# Patient Record
Sex: Female | Born: 1990 | Race: White | Hispanic: No | Marital: Single | Smoking: Never smoker
Health system: Southern US, Community
[De-identification: ages and names within clinical notes are randomized; demographics above are authoritative.]

## PROBLEM LIST (undated history)

## (undated) DIAGNOSIS — J45909 Unspecified asthma, uncomplicated: Secondary | ICD-10-CM

## (undated) DIAGNOSIS — F419 Anxiety disorder, unspecified: Secondary | ICD-10-CM

## (undated) DIAGNOSIS — G43909 Migraine, unspecified, not intractable, without status migrainosus: Secondary | ICD-10-CM

## (undated) HISTORY — PX: ADENOIDECTOMY: SUR15

## (undated) HISTORY — PX: TONSILLECTOMY: SUR1361

---

## 2020-07-15 DIAGNOSIS — G44229 Chronic tension-type headache, not intractable: Secondary | ICD-10-CM | POA: Diagnosis not present

## 2020-07-27 DIAGNOSIS — F900 Attention-deficit hyperactivity disorder, predominantly inattentive type: Secondary | ICD-10-CM | POA: Diagnosis not present

## 2020-07-27 DIAGNOSIS — F3132 Bipolar disorder, current episode depressed, moderate: Secondary | ICD-10-CM | POA: Diagnosis not present

## 2020-07-27 DIAGNOSIS — F411 Generalized anxiety disorder: Secondary | ICD-10-CM | POA: Diagnosis not present

## 2020-07-27 DIAGNOSIS — Z1389 Encounter for screening for other disorder: Secondary | ICD-10-CM | POA: Diagnosis not present

## 2020-08-02 DIAGNOSIS — E889 Metabolic disorder, unspecified: Secondary | ICD-10-CM | POA: Diagnosis not present

## 2020-08-02 DIAGNOSIS — R0602 Shortness of breath: Secondary | ICD-10-CM | POA: Diagnosis not present

## 2020-08-02 DIAGNOSIS — Z0000 Encounter for general adult medical examination without abnormal findings: Secondary | ICD-10-CM | POA: Diagnosis not present

## 2020-08-02 DIAGNOSIS — F3181 Bipolar II disorder: Secondary | ICD-10-CM | POA: Diagnosis not present

## 2020-08-09 DIAGNOSIS — H52223 Regular astigmatism, bilateral: Secondary | ICD-10-CM | POA: Diagnosis not present

## 2020-08-12 DIAGNOSIS — F3181 Bipolar II disorder: Secondary | ICD-10-CM | POA: Diagnosis not present

## 2020-08-15 DIAGNOSIS — F3132 Bipolar disorder, current episode depressed, moderate: Secondary | ICD-10-CM | POA: Diagnosis not present

## 2020-08-15 DIAGNOSIS — F900 Attention-deficit hyperactivity disorder, predominantly inattentive type: Secondary | ICD-10-CM | POA: Diagnosis not present

## 2020-08-15 DIAGNOSIS — F411 Generalized anxiety disorder: Secondary | ICD-10-CM | POA: Diagnosis not present

## 2020-08-15 DIAGNOSIS — F5081 Binge eating disorder: Secondary | ICD-10-CM | POA: Diagnosis not present

## 2020-08-24 DIAGNOSIS — G4459 Other complicated headache syndrome: Secondary | ICD-10-CM | POA: Diagnosis not present

## 2020-08-24 DIAGNOSIS — G4701 Insomnia due to medical condition: Secondary | ICD-10-CM | POA: Diagnosis not present

## 2020-08-24 DIAGNOSIS — R202 Paresthesia of skin: Secondary | ICD-10-CM | POA: Diagnosis not present

## 2020-08-24 DIAGNOSIS — G4710 Hypersomnia, unspecified: Secondary | ICD-10-CM | POA: Diagnosis not present

## 2020-08-25 DIAGNOSIS — F3181 Bipolar II disorder: Secondary | ICD-10-CM | POA: Diagnosis not present

## 2020-09-01 DIAGNOSIS — F3181 Bipolar II disorder: Secondary | ICD-10-CM | POA: Diagnosis not present

## 2020-09-05 DIAGNOSIS — F411 Generalized anxiety disorder: Secondary | ICD-10-CM | POA: Diagnosis not present

## 2020-09-05 DIAGNOSIS — F5081 Binge eating disorder: Secondary | ICD-10-CM | POA: Diagnosis not present

## 2020-09-05 DIAGNOSIS — F900 Attention-deficit hyperactivity disorder, predominantly inattentive type: Secondary | ICD-10-CM | POA: Diagnosis not present

## 2020-09-05 DIAGNOSIS — F3132 Bipolar disorder, current episode depressed, moderate: Secondary | ICD-10-CM | POA: Diagnosis not present

## 2020-09-09 DIAGNOSIS — F3181 Bipolar II disorder: Secondary | ICD-10-CM | POA: Diagnosis not present

## 2020-09-13 DIAGNOSIS — G4459 Other complicated headache syndrome: Secondary | ICD-10-CM | POA: Diagnosis not present

## 2020-09-13 DIAGNOSIS — R519 Headache, unspecified: Secondary | ICD-10-CM | POA: Diagnosis not present

## 2020-09-22 DIAGNOSIS — Z713 Dietary counseling and surveillance: Secondary | ICD-10-CM | POA: Diagnosis not present

## 2020-09-23 DIAGNOSIS — F3181 Bipolar II disorder: Secondary | ICD-10-CM | POA: Diagnosis not present

## 2020-09-26 DIAGNOSIS — F900 Attention-deficit hyperactivity disorder, predominantly inattentive type: Secondary | ICD-10-CM | POA: Diagnosis not present

## 2020-09-26 DIAGNOSIS — F5081 Binge eating disorder: Secondary | ICD-10-CM | POA: Diagnosis not present

## 2020-09-26 DIAGNOSIS — F3132 Bipolar disorder, current episode depressed, moderate: Secondary | ICD-10-CM | POA: Diagnosis not present

## 2020-09-26 DIAGNOSIS — F411 Generalized anxiety disorder: Secondary | ICD-10-CM | POA: Diagnosis not present

## 2020-09-27 DIAGNOSIS — Z713 Dietary counseling and surveillance: Secondary | ICD-10-CM | POA: Diagnosis not present

## 2020-09-28 DIAGNOSIS — F3181 Bipolar II disorder: Secondary | ICD-10-CM | POA: Diagnosis not present

## 2020-10-01 DIAGNOSIS — G4710 Hypersomnia, unspecified: Secondary | ICD-10-CM | POA: Diagnosis not present

## 2020-10-03 DIAGNOSIS — Z7189 Other specified counseling: Secondary | ICD-10-CM | POA: Diagnosis not present

## 2020-10-03 DIAGNOSIS — R635 Abnormal weight gain: Secondary | ICD-10-CM | POA: Diagnosis not present

## 2020-10-04 DIAGNOSIS — R635 Abnormal weight gain: Secondary | ICD-10-CM | POA: Diagnosis not present

## 2020-10-04 DIAGNOSIS — Z713 Dietary counseling and surveillance: Secondary | ICD-10-CM | POA: Diagnosis not present

## 2020-10-06 DIAGNOSIS — F3181 Bipolar II disorder: Secondary | ICD-10-CM | POA: Diagnosis not present

## 2020-10-12 DIAGNOSIS — F3181 Bipolar II disorder: Secondary | ICD-10-CM | POA: Diagnosis not present

## 2021-02-09 DIAGNOSIS — J069 Acute upper respiratory infection, unspecified: Secondary | ICD-10-CM | POA: Diagnosis not present

## 2021-03-21 DIAGNOSIS — N760 Acute vaginitis: Secondary | ICD-10-CM | POA: Diagnosis not present

## 2021-03-21 DIAGNOSIS — R1032 Left lower quadrant pain: Secondary | ICD-10-CM | POA: Diagnosis not present

## 2021-03-21 DIAGNOSIS — Z124 Encounter for screening for malignant neoplasm of cervix: Secondary | ICD-10-CM | POA: Diagnosis not present

## 2021-03-21 DIAGNOSIS — E282 Polycystic ovarian syndrome: Secondary | ICD-10-CM | POA: Diagnosis not present

## 2021-03-21 DIAGNOSIS — Z01419 Encounter for gynecological examination (general) (routine) without abnormal findings: Secondary | ICD-10-CM | POA: Diagnosis not present

## 2021-03-23 DIAGNOSIS — R519 Headache, unspecified: Secondary | ICD-10-CM | POA: Diagnosis not present

## 2021-03-23 DIAGNOSIS — F32A Depression, unspecified: Secondary | ICD-10-CM | POA: Diagnosis not present

## 2021-03-23 DIAGNOSIS — G43719 Chronic migraine without aura, intractable, without status migrainosus: Secondary | ICD-10-CM | POA: Diagnosis not present

## 2021-05-08 ENCOUNTER — Encounter: Payer: Managed Care, Other (non HMO) | Attending: Obstetrics and Gynecology | Admitting: Registered"

## 2021-05-08 ENCOUNTER — Other Ambulatory Visit: Payer: Self-pay

## 2021-05-08 ENCOUNTER — Encounter: Payer: Self-pay | Admitting: Registered"

## 2021-05-08 DIAGNOSIS — E282 Polycystic ovarian syndrome: Secondary | ICD-10-CM | POA: Insufficient documentation

## 2021-05-08 NOTE — Progress Notes (Signed)
Medical Nutrition Therapy  Appointment Start time:  0205  Appointment End time:  0305  Primary concerns today: PCOS  Referral diagnosis: PCOS Preferred learning style: no preference indicated Learning readiness: ready   NUTRITION ASSESSMENT   Anthropometrics  Not assessed   Clinical Medical Hx: PCOS Medications: (finished high dose vit D ) Labs: 08/02/2020 A1c 5.2%; vitamin D 15(L) Notable Signs/Symptoms: hair growth on chin, tired all the time  Lifestyle & Dietary Hx Pt states she has h/o weight mgmt medication, gym regularly, unspecified eating disorder, low self-esteem. Pt states she first noticed extra hair grown on face recently and also started gaining weight quickly without lifestyle changes, was noticeable when new clothes became tight within a few months..  Pt states she does not eat breakfast because nausea in the morning and has been happening since childhood. Pt states she is generally anxious.  Pt states she is tired all the time. Pt reports it is hard to wake up and hard to stay asleep. Pt wakes up with just enough time to ready for work, has not been able to wake up earlier. Pt reports evening routine includes reading or watching TV before bed, and showers in the evening, goes to bed around 12-1 am. Pt states she wakes up at 7 am if morning shift, later shift sleeps in until 10 am, naps 3-4 x/week 45 min - 3 hours. 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  Pt states she used to do yoga but stopped due to work schedule.   Estimated daily fluid intake: "a lot" of water Supplements: none Sleep: see details above Stress / self-care: 6/10; hot bath, TV Current average weekly physical activity: ADL active at work, Personal assistant, Occupational hygienist, Build a Electrical engineer. 30 min break for lunch  24-Hr Dietary Recall First Meal: not hungry - has nauseous stomach Snack:  Second Meal: nearby restaurant ([employer], 2 cheeseburgers or  chicken nuggets, tea or Dr. Reino Kent) OR cup o noodles Snack: PB crackers OR hummus Third Meal: take out slidder sandwiches, handful of popcorn Snack: sometimes something light miso soup Beverages: mostly water, started drinking soda again 4-5x/week, coffee latte  Estimated Energy Needs Calories: 1600   NUTRITION DIAGNOSIS  NB-1.1 Food and nutrition-related knowledge deficit As related to importance of protein in the morning.  As evidenced by dietary recall skips breakfast.   NUTRITION INTERVENTION  Nutrition education (E-1) on the following topics:  PCOS Weight and hormones Insulin and testosterone Sleep and stress Supplements for PCOS  Handouts Provided Include  Sleep Hygeine Email PCOS nutrition PDF Inositol and PCOS  Learning Style & Readiness for Change Teaching method utilized: Visual & Auditory  Demonstrated degree of understanding via: Teach Back  Barriers to learning/adherence to lifestyle change: none  Goals Established by Pt Vitamin D supplement, start with 2000 units until you have another lab test Inositol supplement handout has ideas for brands Review the PDF for nutrition guidelines for PCOS  stress reduction:  Use the Sleep Hygiene handout for ideas to get better sleep. 20-30 min 2-3 x/week on days off or when you go to work earlier: Yoga and/or Mindfulness resource https://palousemindfulness.com/  When your stomach is better, in the morning start having protein and some carb for breakfast.   MONITORING & EVALUATION Dietary intake, weekly physical activity, and sleep in 4-6 weeks.  Next Steps  Patient is to return for continued education and evaluation.

## 2021-05-08 NOTE — Patient Instructions (Addendum)
Vitamin D supplement, start with 2000 units until you have another lab test Inositol supplement handout has ideas for brands Review the PDF for nutrition guidelines for PCOS  stress reduction:  Use the Sleep Hygiene handout for ideas to get better sleep. 20-30 min 2-3 x/week on days off or when you go to work earlier: Yoga and/or Mindfulness resource https://palousemindfulness.com/  When your stomach is better, in the morning start having protein and some carb for breakfast.

## 2021-06-12 ENCOUNTER — Ambulatory Visit: Payer: Managed Care, Other (non HMO) | Admitting: Registered"

## 2021-07-03 ENCOUNTER — Ambulatory Visit: Payer: Managed Care, Other (non HMO) | Admitting: Registered"

## 2021-07-03 NOTE — Progress Notes (Deleted)
Medical Nutrition Therapy  Appointment Start time:  0205  Appointment End time:  0305  Primary concerns today: PCOS  Referral diagnosis: PCOS Preferred learning style: no preference indicated Learning readiness: ready   NUTRITION ASSESSMENT   Anthropometrics  Not assessed   Clinical Medical Hx: PCOS Medications: (finished high dose vit D ) Labs: 08/02/2020 A1c 5.2%; vitamin D 15(L) Notable Signs/Symptoms: hair growth on chin, tired all the time  Lifestyle & Dietary Hx Pt states she has h/o weight mgmt medication, gym regularly, unspecified eating disorder, low self-esteem. Pt states she first noticed extra hair grown on face recently and also started gaining weight quickly without lifestyle changes, was noticeable when new clothes became tight within a few months..  Pt states she does not eat breakfast because nausea in the morning and has been happening since childhood. Pt states she is generally anxious.  Pt states she is tired all the time. Pt reports it is hard to wake up and hard to stay asleep. Pt wakes up with just enough time to ready for work, has not been able to wake up earlier. Pt reports evening routine includes reading or watching TV before bed, and showers in the evening, goes to bed around 12-1 am. Pt states she wakes up at 7 am if morning shift, later shift sleeps in until 10 am, naps 3-4 x/week 45 min - 3 hours. 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  Pt states she used to do yoga but stopped due to work schedule.   Estimated daily fluid intake: "a lot" of water Supplements: none Sleep: see details above Stress / self-care: 6/10; hot bath, TV Current average weekly physical activity: ADL active at work, lifting boxes, retail manager, [employer]. 30 min break for lunch  24-Hr Dietary Recall First Meal: not hungry - has nauseous stomach Snack:  Second Meal: nearby restaurant ([employer], 2 cheeseburgers or  chicken nuggets, tea or Dr. Pepper) OR cup o noodles Snack: PB crackers OR hummus Third Meal: take out slidder sandwiches, handful of popcorn Snack: sometimes something light miso soup Beverages: mostly water, started drinking soda again 4-5x/week, coffee latte  Estimated Energy Needs Calories: 1600   NUTRITION DIAGNOSIS  NB-1.1 Food and nutrition-related knowledge deficit As related to importance of protein in the morning.  As evidenced by dietary recall skips breakfast.   NUTRITION INTERVENTION  Nutrition education (E-1) on the following topics:  PCOS Weight and hormones Insulin and testosterone Sleep and stress Supplements for PCOS  Handouts Provided Include  Sleep Hygeine Email PCOS nutrition PDF Inositol and PCOS  Learning Style & Readiness for Change Teaching method utilized: Visual & Auditory  Demonstrated degree of understanding via: Teach Back  Barriers to learning/adherence to lifestyle change: none  Goals Established by Pt Vitamin D supplement, start with 2000 units until you have another lab test Inositol supplement handout has ideas for brands Review the PDF for nutrition guidelines for PCOS  stress reduction:  Use the Sleep Hygiene handout for ideas to get better sleep. 20-30 min 2-3 x/week on days off or when you go to work earlier: Yoga and/or Mindfulness resource https://palousemindfulness.com/  When your stomach is better, in the morning start having protein and some carb for breakfast.   MONITORING & EVALUATION Dietary intake, weekly physical activity, and sleep in 4-6 weeks.  Next Steps  Patient is to return for continued education and evaluation.  

## 2021-11-09 ENCOUNTER — Emergency Department (HOSPITAL_COMMUNITY)
Admission: EM | Admit: 2021-11-09 | Discharge: 2021-11-10 | Disposition: A | Discharge status: Home / Self Care | Payer: Medicaid Other | Attending: Emergency Medicine | Admitting: Emergency Medicine

## 2021-11-09 ENCOUNTER — Emergency Department (HOSPITAL_COMMUNITY): Payer: Medicaid Other

## 2021-11-09 ENCOUNTER — Other Ambulatory Visit: Payer: Self-pay

## 2021-11-09 ENCOUNTER — Encounter (HOSPITAL_COMMUNITY): Payer: Self-pay | Admitting: Emergency Medicine

## 2021-11-09 DIAGNOSIS — R0602 Shortness of breath: Secondary | ICD-10-CM | POA: Insufficient documentation

## 2021-11-09 DIAGNOSIS — G43809 Other migraine, not intractable, without status migrainosus: Secondary | ICD-10-CM | POA: Insufficient documentation

## 2021-11-09 DIAGNOSIS — Z7951 Long term (current) use of inhaled steroids: Secondary | ICD-10-CM | POA: Insufficient documentation

## 2021-11-09 DIAGNOSIS — J45909 Unspecified asthma, uncomplicated: Secondary | ICD-10-CM | POA: Insufficient documentation

## 2021-11-09 DIAGNOSIS — R000 Tachycardia, unspecified: Secondary | ICD-10-CM | POA: Insufficient documentation

## 2021-11-09 DIAGNOSIS — R791 Abnormal coagulation profile: Secondary | ICD-10-CM | POA: Insufficient documentation

## 2021-11-09 HISTORY — DX: Anxiety disorder, unspecified: F41.9

## 2021-11-09 HISTORY — DX: Unspecified asthma, uncomplicated: J45.909

## 2021-11-09 HISTORY — DX: Migraine, unspecified, not intractable, without status migrainosus: G43.909

## 2021-11-09 LAB — CBC WITH DIFFERENTIAL/PLATELET
Abs Immature Granulocytes: 0.02 10*3/uL (ref 0.00–0.07)
Basophils Absolute: 0 10*3/uL (ref 0.0–0.1)
Basophils Relative: 0 %
Eosinophils Absolute: 0.1 10*3/uL (ref 0.0–0.5)
Eosinophils Relative: 2 %
HCT: 42 % (ref 36.0–46.0)
Hemoglobin: 13.5 g/dL (ref 12.0–15.0)
Immature Granulocytes: 0 %
Lymphocytes Relative: 25 %
Lymphs Abs: 1.7 10*3/uL (ref 0.7–4.0)
MCH: 27.5 pg (ref 26.0–34.0)
MCHC: 32.1 g/dL (ref 30.0–36.0)
MCV: 85.5 fL (ref 80.0–100.0)
Monocytes Absolute: 0.5 10*3/uL (ref 0.1–1.0)
Monocytes Relative: 7 %
Neutro Abs: 4.5 10*3/uL (ref 1.7–7.7)
Neutrophils Relative %: 66 %
Platelets: 432 10*3/uL — ABNORMAL HIGH (ref 150–400)
RBC: 4.91 MIL/uL (ref 3.87–5.11)
RDW: 12.6 % (ref 11.5–15.5)
WBC: 6.9 10*3/uL (ref 4.0–10.5)
nRBC: 0 % (ref 0.0–0.2)

## 2021-11-09 LAB — RAPID URINE DRUG SCREEN, HOSP PERFORMED
Amphetamines: NOT DETECTED
Barbiturates: NOT DETECTED
Benzodiazepines: NOT DETECTED
Cocaine: NOT DETECTED
Opiates: NOT DETECTED
Tetrahydrocannabinol: NOT DETECTED

## 2021-11-09 LAB — COMPREHENSIVE METABOLIC PANEL
ALT: 17 U/L (ref 0–44)
AST: 17 U/L (ref 15–41)
Albumin: 3.6 g/dL (ref 3.5–5.0)
Alkaline Phosphatase: 67 U/L (ref 38–126)
Anion gap: 7 (ref 5–15)
BUN: 5 mg/dL — ABNORMAL LOW (ref 6–20)
CO2: 20 mmol/L — ABNORMAL LOW (ref 22–32)
Calcium: 9 mg/dL (ref 8.9–10.3)
Chloride: 112 mmol/L — ABNORMAL HIGH (ref 98–111)
Creatinine, Ser: 0.53 mg/dL (ref 0.44–1.00)
GFR, Estimated: >60 mL/min (ref >60)
Glucose, Bld: 100 mg/dL — ABNORMAL HIGH (ref 70–99)
Potassium: 3.9 mmol/L (ref 3.5–5.1)
Sodium: 139 mmol/L (ref 135–145)
Total Bilirubin: 0.2 mg/dL — ABNORMAL LOW (ref 0.3–1.2)
Total Protein: 7.4 g/dL (ref 6.5–8.1)

## 2021-11-09 LAB — D-DIMER, QUANTITATIVE: D-Dimer, Quant: 0.53 ug/mL-FEU — ABNORMAL HIGH (ref 0.00–0.50)

## 2021-11-09 LAB — I-STAT BETA HCG BLOOD, ED (MC, WL, AP ONLY): I-stat hCG, quantitative: <5 m[IU]/mL (ref <5)

## 2021-11-09 LAB — ETHANOL: Alcohol, Ethyl (B): <10 mg/dL (ref <10)

## 2021-11-09 MED ORDER — IOHEXOL 350 MG/ML SOLN
59.0000 mL | Freq: Once | INTRAVENOUS | Status: AC | PRN
  Administered 2021-11-09: 59 mL via INTRAVENOUS

## 2021-11-09 MED ORDER — KETOROLAC TROMETHAMINE 15 MG/ML IJ SOLN
15.0000 mg | Freq: Once | INTRAMUSCULAR | Status: AC
  Administered 2021-11-09: 15 mg via INTRAVENOUS
  Filled 2021-11-09: qty 1

## 2021-11-09 MED ORDER — DEXAMETHASONE SODIUM PHOSPHATE 10 MG/ML IJ SOLN
10.0000 mg | Freq: Once | INTRAMUSCULAR | Status: AC
  Administered 2021-11-09: 10 mg via INTRAVENOUS
  Filled 2021-11-09: qty 1

## 2021-11-09 MED ORDER — METOCLOPRAMIDE HCL 5 MG/ML IJ SOLN
10.0000 mg | Freq: Once | INTRAMUSCULAR | Status: AC
  Administered 2021-11-09: 10 mg via INTRAVENOUS
  Filled 2021-11-09: qty 2

## 2021-11-09 NOTE — ED Provider Triage Note (Signed)
Emergency Medicine Provider Triage Evaluation Note  Philomena Doheny , a 31 y.o. female  was evaluated in triage.  Pt complains of band like headache for the past few days. Today at 10:40 was driving to work when she noticed tingling in her left arm and left cheek area, also blurry vision. Symptoms lasted a few min and resolved completely with exception of band like headache. History of migraines, states this is different. No changes in speech or gait.   Review of Systems  Positive: Headache, paresthesia (resolved) Negative: Changes in speech/gait  Physical Exam  There were no vitals taken for this visit. Gen:   Awake, no distress   Resp:  Normal effort  MSK:   Moves extremities without difficulty  Other:  No arm weakness/drift, no numbness, no facial asymmetry  Medical Decision Making  Medically screening exam initiated at 11:18 AM.  Appropriate orders placed.  [place] Razzano was informed that the remainder of the evaluation will be completed by another provider, this initial triage assessment does not replace that evaluation, and the importance of remaining in the ED until their evaluation is complete.     Tacy Learn, PA-C 11/09/21 1132

## 2021-11-09 NOTE — ED Notes (Signed)
Patient transported to CT 

## 2021-11-09 NOTE — ED Provider Notes (Signed)
[facility]   CSN: [id number] Arrival date & time: 11/09/21  1116     History  Chief Complaint  Patient presents with   Headache    Kimberly Wolf is a 31 y.o. female.  Patient is a 31 year old female with a history of migraines, asthma and anxiety who is presenting today with paramedics after having sudden onset of tingling in her left face arm and blurred vision with a worsening headache while she was driving.  Patient reports for the last 4 days she has had ongoing headache that feels like a band going around her head in addition to a otitis externa that has been treated with drops.  She reports the ear issues are getting better she has not had fever, runny nose or cough but continues to feel the pressure in her head.  Moves around and tingles and feels slightly different than her typical migraines.  Today while she was driving she developed the above symptoms suddenly which caused her to pull over and lasted approximately 5 minutes and resolved by the time EMS arrived.  Her headache has been more severe since that time but those symptoms have not returned.  She had no difficulty with speech, walking or strength.  She reports 1 other time with her migraines she has had similar symptoms but they were not that severe.  She continues to take the Topamax for her migraines which she has been on for about a year.  She has no neck pain. Secondly patient reports that for the last 1 to 2 months she has noticed progressively worsening shortness of breath with any type of exertion that is not improved with her inhaler.  She has not had a new cough, fever.  She does not smoke but does report 6 months ago she had her Nexplanon removed and has been on an estrogen birth control since that time.  The history is provided by the spouse and the patient.  Headache      Home Medications Prior to Admission medications   Medication Sig Start Date End Date  Taking? Authorizing Provider  ALBUTEROL SULFATE ER PO Take by mouth.    [provider]  Norgestimate-Eth Estradiol (ESTARYLLA PO) Take by mouth.    [provider]      Allergies    Patient has no known allergies.    Review of Systems   Review of Systems  Neurological:  Positive for headaches.    Physical Exam Updated Vital Signs BP (!) 126/95   Pulse 94   Temp 99.1 F (37.3 C) (Oral)   Resp 17   SpO2 100%  Physical Exam Vitals and nursing note reviewed.  Constitutional:      General: She is not in acute distress.    Appearance: She is well-developed.  HENT:     Head: Normocephalic and atraumatic.  Eyes:     General: No visual field deficit.    Conjunctiva/sclera: Conjunctivae normal.     Pupils: Pupils are equal, round, and reactive to light.  Cardiovascular:     Rate and Rhythm: Normal rate and regular rhythm.     Heart sounds: No murmur heard. Pulmonary:     Effort: Pulmonary effort is normal. No respiratory distress.     Breath sounds: Normal breath sounds. No wheezing or rales.  Abdominal:     General: There is no distension.     Palpations: Abdomen is soft.     Tenderness: There is no abdominal  tenderness. There is no guarding or rebound.  Musculoskeletal:        General: No tenderness. Normal range of motion.     Cervical back: Normal range of motion and neck supple. No tenderness.  Skin:    General: Skin is warm and dry.     Findings: No erythema or rash.  Neurological:     Mental Status: She is alert and oriented to person, place, and time. Mental status is at baseline.     Cranial Nerves: No cranial nerve deficit, dysarthria or facial asymmetry.     Sensory: No sensory deficit.     Motor: No weakness or pronator drift.     Coordination: Coordination normal. Heel to Shin Test normal.  Psychiatric:        Mood and Affect: Mood normal.        Behavior: Behavior normal.     ED Results / Procedures / Treatments   Labs (all labs  ordered are listed, but only abnormal results are displayed) Labs Reviewed  CBC WITH DIFFERENTIAL/PLATELET - Abnormal; Notable for the following components:      Result Value   Platelets 432 (*)    All other components within normal limits  COMPREHENSIVE METABOLIC PANEL - Abnormal; Notable for the following components:   Chloride 112 (*)    CO2 20 (*)    Glucose, Bld 100 (*)    BUN 5 (*)    Total Bilirubin 0.2 (*)    All other components within normal limits  ETHANOL  RAPID URINE DRUG SCREEN, HOSP PERFORMED  D-DIMER, QUANTITATIVE  I-STAT BETA HCG BLOOD, ED ([facility], [facility], [facility] ONLY)    EKG None  Radiology CT Head Wo Contrast  Result Date: 11/09/2021 CLINICAL DATA:  Transient ischemic attack (TIA) EXAM: CT HEAD WITHOUT CONTRAST TECHNIQUE: Contiguous axial images were obtained from the base of the skull through the vertex without intravenous contrast. RADIATION DOSE REDUCTION: This exam was performed according to the departmental dose-optimization program which includes automated exposure control, adjustment of the mA and/or kV according to patient size and/or use of iterative reconstruction technique. COMPARISON:  None Available. FINDINGS: Brain: No evidence of acute large vascular territory infarction, hemorrhage, hydrocephalus, extra-axial collection or mass lesion/mass effect. Vascular: No hyperdense vessel identified. Skull: No acute fracture. Sinuses/Orbits: Minimal paranasal sinus mucosal thickening. No acute orbital findings. Other: No mastoid effusions. IMPRESSION: No evidence of acute intracranial abnormality. Electronically Signed   By: Feliberto Harts M.D.   On: 11/09/2021 13:23    Procedures Procedures    Medications Ordered in ED Medications  metoCLOPramide (REGLAN) injection 10 mg (has no administration in time range)  ketorolac (TORADOL) 15 MG/ML injection 15 mg (has no administration in time range)  dexamethasone (DECADRON) injection 10 mg (has no administration in time  range)    ED Course/ Medical Decision Making/ A&P                           Medical Decision Making Amount and/or Complexity of Data Reviewed External Data Reviewed: notes. Labs: ordered. Decision-making details documented in ED Course. Radiology: ordered and independent interpretation performed. Decision-making details documented in ED Course. ECG/medicine tests: ordered and independent interpretation performed. Decision-making details documented in ED Course.  Risk Prescription drug management.   Patient is a 31 year old female presenting today with headache and neurologic complaints.  The neuro complaints lasted approximately 5 minutes but have now resolved and she continues with a headache.  It is slightly  different than her typical migraine headache but neuro exam is intact.  Patient did recently started on an estrogen birth control 6 months ago and has also been complaining of some shortness of breath.  Temperature is 99.1 here with mild tachycardia relative to age at 47.  Concern for possible PE with increased risk of being on birth control.  Lower suspicious for cavernous thrombosis.  Suspect complicated migraine.  Low suspicion for sinusitis or other infectious causes of patient's headache.  Neurologically intact without any acute findings. I independently interpreted patient's labs today which show normal CBC, CMP, UDS and urine pregnancy. I have independently visualized and interpreted pt's images today.  Showed no evidence of intracranial hemorrhage or hydrocephalus.  D-dimer done based on patient's complaint of shortness of breath and recent OCP initiation.  10:12 PM On reevaluation patient's headache has improved with the headache cocktail but unfortunately D-dimer is elevated at 0.53.  Findings discussed with the patient.  CTA to further evaluate and rule out PE.         Final Clinical Impression(s) / ED Diagnoses Final diagnoses:  None    Rx / DC Orders ED  Discharge Orders     None         Gwyneth Sprout, MD 11/09/21 2322

## 2021-11-09 NOTE — ED Notes (Signed)
This RN attempted PIV for CT scan and was unsuccessful due to patient being a difficult stick. CT tech at bedside to attempt PIV at this time.

## 2021-11-09 NOTE — ED Triage Notes (Signed)
Pt was driving to work and had sudden onset of headache into temples. Pt had recent left ear infection. Pt states she had a brief period of blurry vision while driving so she pulled off the road and called ems. Vision is better, but pt still has tightness in her head. No slurred speech, no weakness.

## 2021-11-09 NOTE — ED Notes (Signed)
ED Provider at bedside. 

## 2021-11-10 NOTE — ED Notes (Signed)
All discharge instructions including follow up care and prescriptions reviewed with patient and patient verbalized understanding of same. Patient stable and ambulatory at time of discharge and was discharged home with ride from family member.

## 2021-11-10 NOTE — ED Provider Notes (Signed)
Patient signed out to me by Dr. Anitra Lauth to follow-up on CT angio chest.  Patient was seen for migraine headache, treated with medications and headache improved.  She did, however, reports some shortness of breath.  She had some intermittent episodes of tachycardia and is on an estrogen based birth control pill, therefore CT was ordered when her D-dimer was slightly elevated.  This has been reviewed and is negative.  Patient does demo that she has a history of asthma and this may be the cause of some of her shortness of breath.  She reports that this has been ongoing over a period of months.  She does not currently have a PCP but does have a supply of albuterol.  I do not see a need for steroids at this time.   Gilda Crease, MD 11/10/21 [phone]

## 2021-11-23 ENCOUNTER — Ambulatory Visit
Admission: RE | Admit: 2021-11-23 | Discharge status: Absent without leave | Payer: Medicaid Other | Source: Ambulatory Visit

## 2021-11-23 DIAGNOSIS — Z760 Encounter for issue of repeat prescription: Secondary | ICD-10-CM | POA: Diagnosis not present

## 2021-11-23 DIAGNOSIS — Z0001 Encounter for general adult medical examination with abnormal findings: Secondary | ICD-10-CM | POA: Diagnosis not present

## 2021-11-28 DIAGNOSIS — F411 Generalized anxiety disorder: Secondary | ICD-10-CM | POA: Diagnosis not present

## 2021-11-28 DIAGNOSIS — F5081 Binge eating disorder: Secondary | ICD-10-CM | POA: Diagnosis not present

## 2021-11-28 DIAGNOSIS — F900 Attention-deficit hyperactivity disorder, predominantly inattentive type: Secondary | ICD-10-CM | POA: Diagnosis not present

## 2021-11-28 DIAGNOSIS — F331 Major depressive disorder, recurrent, moderate: Secondary | ICD-10-CM | POA: Diagnosis not present

## 2021-11-30 DIAGNOSIS — F3181 Bipolar II disorder: Secondary | ICD-10-CM | POA: Diagnosis not present

## 2021-12-07 DIAGNOSIS — F3181 Bipolar II disorder: Secondary | ICD-10-CM | POA: Diagnosis not present

## 2021-12-22 DIAGNOSIS — F411 Generalized anxiety disorder: Secondary | ICD-10-CM | POA: Diagnosis not present

## 2021-12-22 DIAGNOSIS — F5081 Binge eating disorder: Secondary | ICD-10-CM | POA: Diagnosis not present

## 2021-12-22 DIAGNOSIS — F340 Cyclothymic disorder: Secondary | ICD-10-CM | POA: Diagnosis not present

## 2021-12-22 DIAGNOSIS — F900 Attention-deficit hyperactivity disorder, predominantly inattentive type: Secondary | ICD-10-CM | POA: Diagnosis not present

## 2021-12-26 DIAGNOSIS — F3181 Bipolar II disorder: Secondary | ICD-10-CM | POA: Diagnosis not present

## 2022-01-02 DIAGNOSIS — G43839 Menstrual migraine, intractable, without status migrainosus: Secondary | ICD-10-CM | POA: Diagnosis not present

## 2022-01-02 DIAGNOSIS — Z79899 Other long term (current) drug therapy: Secondary | ICD-10-CM | POA: Diagnosis not present

## 2022-01-02 DIAGNOSIS — G43719 Chronic migraine without aura, intractable, without status migrainosus: Secondary | ICD-10-CM | POA: Diagnosis not present

## 2022-01-02 DIAGNOSIS — F3181 Bipolar II disorder: Secondary | ICD-10-CM | POA: Diagnosis not present

## 2022-01-02 DIAGNOSIS — Z049 Encounter for examination and observation for unspecified reason: Secondary | ICD-10-CM | POA: Diagnosis not present

## 2022-01-04 DIAGNOSIS — N760 Acute vaginitis: Secondary | ICD-10-CM | POA: Diagnosis not present

## 2022-01-04 DIAGNOSIS — L738 Other specified follicular disorders: Secondary | ICD-10-CM | POA: Diagnosis not present

## 2022-01-04 DIAGNOSIS — B372 Candidiasis of skin and nail: Secondary | ICD-10-CM | POA: Diagnosis not present

## 2022-01-19 DIAGNOSIS — F411 Generalized anxiety disorder: Secondary | ICD-10-CM | POA: Diagnosis not present

## 2022-01-19 DIAGNOSIS — F5081 Binge eating disorder: Secondary | ICD-10-CM | POA: Diagnosis not present

## 2022-01-19 DIAGNOSIS — F900 Attention-deficit hyperactivity disorder, predominantly inattentive type: Secondary | ICD-10-CM | POA: Diagnosis not present

## 2022-01-19 DIAGNOSIS — F340 Cyclothymic disorder: Secondary | ICD-10-CM | POA: Diagnosis not present

## 2022-01-22 ENCOUNTER — Institutional Professional Consult (permissible substitution): Payer: BC Managed Care – PPO | Admitting: Nurse Practitioner

## 2022-01-23 DIAGNOSIS — M542 Cervicalgia: Secondary | ICD-10-CM | POA: Diagnosis not present

## 2022-01-23 DIAGNOSIS — G43719 Chronic migraine without aura, intractable, without status migrainosus: Secondary | ICD-10-CM | POA: Diagnosis not present

## 2022-02-01 ENCOUNTER — Ambulatory Visit (INDEPENDENT_AMBULATORY_CARE_PROVIDER_SITE_OTHER): Payer: BC Managed Care – PPO | Admitting: Nurse Practitioner

## 2022-02-01 ENCOUNTER — Encounter: Payer: Self-pay | Admitting: Nurse Practitioner

## 2022-02-01 VITALS — BP 110/80 | HR 109 | Ht 62.0 in | Wt 197.6 lb

## 2022-02-01 DIAGNOSIS — E669 Obesity, unspecified: Secondary | ICD-10-CM

## 2022-02-01 DIAGNOSIS — G4710 Hypersomnia, unspecified: Secondary | ICD-10-CM

## 2022-02-01 NOTE — Progress Notes (Signed)
@Patient  ID: Kimberly Wolf, female    DOB: 04/01/1991, 31 y.o.   MRN: [id number]  Chief Complaint  Patient presents with   Consult    Sleep study completed 22yr ago    Referring provider: Orie Rout, MD  HPI: 31 year old female, never smoker referred for sleep consult.  Past medical history significant for migraine headaches.  TEST/EVENTS:  10/01/2020 HST: AHI 1.8, SPO2 low 88%  02/01/2022: Today - sleep consult  Patient presents today for sleep consult, referred by Dr. Domingo Cocking.  She has been struggling with excessive daytime fatigue symptoms for the past few years.  She falls asleep easily throughout the day without intending to.  Finds herself nodding off unless she is actively participating in a conversation with somebody or up doing something.  She struggles with memory impairment from time to time.  Forgets things easily.  She struggles with restless sleep at night.  Wakes up frequently throughout the night.  Tosses and turns often.  Also notices having vivid dreams.  She also has had a problem with chronic migraine headaches for many years now, which she sees neurology for.  Because of this, they wanted her to undergo sleep study to rule out obstructive sleep apnea, which she completed 10/01/2020 and showed no significant sleep apnea.  She has trouble with drowsy driving.  Has not fallen asleep while driving.  Denies any sleep paralysis or cataplexy.  She has never been told that she snores.  Does not wake up gasping for air. She goes to bed around 10 to 11 PM but it takes her about an hour to fall asleep so she ends up not falling asleep until around midnight.  Wakes up 3 or more times a night.  Officially gets out of the bed in the morning around 630 to 7 AM.  Wakes feeling poorly rested.  She does not take anything to help her fall asleep at night.  Does not drink any caffeine throughout the day. Her weight is up about 30 pounds.  No significant cardiac history.  She does not  smoke.  Rarely drinks alcohol.  Lives with her family.  She works as a Secretary/administrator.  Does not operate any heavy machinery.  She does have a history of asthma which is well controlled.  Epworth 14  No Known Allergies   There is no immunization history on file for this patient.  Past Medical History:  Diagnosis Date   Anxiety    Asthma    Migraines     Tobacco History: Social History   Tobacco Use  Smoking Status Never  Smokeless Tobacco Never   Counseling given: Not Answered   Outpatient Medications Prior to Visit  Medication Sig Dispense Refill   ALBUTEROL SULFATE ER PO Take by mouth.     Norgestimate-Eth Estradiol (ESTARYLLA PO) Take by mouth.     VRAYLAR 1.5 MG capsule Take 1.5 mg by mouth daily.     zonisamide (ZONEGRAN) 25 MG capsule Take 100 mg by mouth daily.     No facility-administered medications prior to visit.     Review of Systems:   Constitutional: No night sweats, fevers, chills. +excessive daytime fatigue, weight gain (30 lb) HEENT: No difficulty swallowing, tooth/dental problems, or sore throat. No sneezing, itching, ear ache, nasal congestion, or post nasal drip. +chronic headaches CV:  No chest pain, orthopnea, PND, swelling in lower extremities, anasarca, dizziness, palpitations, syncope Resp: No shortness of breath with exertion or at rest. No excess mucus or change in  color of mucus. No productive or non-productive. No hemoptysis. No wheezing.  No chest wall deformity GI:  No heartburn, indigestion, abdominal pain, nausea, vomiting, diarrhea, change in bowel habits, loss of appetite, bloody stools.  GU: No dysuria, change in color of urine, urgency or frequency.  No flank pain, no hematuria  Skin: No rash, lesions, ulcerations MSK:  No joint pain or swelling.  No decreased range of motion.  No back pain. Neuro: No dizziness or lightheadedness.  Psych: No depression or anxiety. Mood stable. +sleep disturbance    Physical Exam:  BP 110/80 (BP  Location: Right Arm, Cuff Size: Normal)   Pulse (!) 109   Ht 5\' 2"  (1.575 m)   Wt 197 lb 9.6 oz (89.6 kg)   SpO2 98%   BMI 36.14 kg/m   GEN: Pleasant, interactive, well-appearing; obese; in no acute distress. HEENT:  Normocephalic and atraumatic. PERRLA. Sclera white. Nasal turbinates pink, moist and patent bilaterally. No rhinorrhea present. Oropharynx pink and moist, without exudate or edema. No lesions, ulcerations, or postnasal drip. Mallampati II NECK:  Supple w/ fair ROM. No JVD present. Normal carotid impulses w/o bruits. Thyroid symmetrical with no goiter or nodules palpated. No lymphadenopathy.   CV: RRR, no m/r/g, no peripheral edema. Pulses intact, +2 bilaterally. No cyanosis, pallor or clubbing. PULMONARY:  Unlabored, regular breathing. Clear bilaterally A&P w/o wheezes/rales/rhonchi. No accessory muscle use. No dullness to percussion. GI: BS present and normoactive. Soft, non-tender to palpation.  MSK: No erythema, warmth or tenderness.  Neuro: A/Ox3. No focal deficits noted.   Skin: Warm, no lesions or rashe Psych: Normal affect and behavior. Judgement and thought content appropriate.     Lab Results:  CBC    Component Value Date/Time   WBC 6.9 11/09/2021 1140   RBC 4.91 11/09/2021 1140   HGB 13.5 11/09/2021 1140   HCT 42.0 11/09/2021 1140   PLT 432 (H) 11/09/2021 1140   MCV 85.5 11/09/2021 1140   MCH 27.5 11/09/2021 1140   MCHC 32.1 11/09/2021 1140   RDW 12.6 11/09/2021 1140   LYMPHSABS 1.7 11/09/2021 1140   MONOABS 0.5 11/09/2021 1140   EOSABS 0.1 11/09/2021 1140   BASOSABS 0.0 11/09/2021 1140    BMET    Component Value Date/Time   NA 139 11/09/2021 1140   K 3.9 11/09/2021 1140   CL 112 (H) 11/09/2021 1140   CO2 20 (L) 11/09/2021 1140   GLUCOSE 100 (H) 11/09/2021 1140   BUN 5 (L) 11/09/2021 1140   CREATININE 0.53 11/09/2021 1140   CALCIUM 9.0 11/09/2021 1140   GFRNONAA >60 11/09/2021 1140    BNP No results found for: "BNP"   Imaging:  No  results found.        No data to display          No results found for: "NITRICOXIDE"      Assessment & Plan:   Hypersomnia She has excessive daytime fatigue symptoms, unintentionally falls asleep throughout the day, restless night sleep, and chronic headaches. Young female. Epworth 14. She underwent HST in 09/2020 with no significant OSA. I am concerned that she could have narcolepsy. We will ensure that she does not have any sleep disordered breathing with split night sleep study and then complete MSLT the following day for evaluation of narcolepsy. Encouraged her to work on sleep hygiene and keep a sleep diary in the interim. Cautioned to be aware of drowsy driving and pull over if she becomes sleepy.  Patient Instructions  Given your symptoms,  I am concerned for sleep disordered breathing as well as possible narcolepsy. We will set you up for an overnight split night sleep study with sleep latency test to follow.   Use caution while driving and pull over if you become sleepy  Try to schedule 1-2 naps during the day, if possible, and see how this helps  Keep a sleep diary Write down: When you sleep. When you wake up during the night. How well you sleep and how rested you feel the next day. Any side effects of medicines you are taking. What you eat and drink.  Follow up in 6-8 weeks with Florentina Addison Charleton Deyoung,NP or sooner if needed     Obesity (BMI 30.0-34.9) Healthy weight loss encouraged. Discussed the association between obesity and OSA.   I spent 45 minutes of dedicated to the care of this patient on the date of this encounter to include pre-visit review of records, face-to-face time with the patient discussing conditions above, post visit ordering of testing, clinical documentation with the electronic health record, making appropriate referrals as documented, and communicating necessary findings to members of the patients care team.  Noemi Chapel, NP 02/02/2022  Pt aware  and understands NP's role.

## 2022-02-01 NOTE — Patient Instructions (Addendum)
Given your symptoms, I am concerned for sleep disordered breathing as well as possible narcolepsy. We will set you up for an overnight split night sleep study with sleep latency test to follow.   Use caution while driving and pull over if you become sleepy  Try to schedule 1-2 naps during the day, if possible, and see how this helps  Keep a sleep diary Write down: When you sleep. When you wake up during the night. How well you sleep and how rested you feel the next day. Any side effects of medicines you are taking. What you eat and drink.  Follow up in 6-8 weeks with Florentina Addison Satchel Heidinger,NP or sooner if needed

## 2022-02-02 ENCOUNTER — Encounter: Payer: Self-pay | Admitting: Nurse Practitioner

## 2022-02-02 DIAGNOSIS — E66811 Obesity, class 1: Secondary | ICD-10-CM | POA: Insufficient documentation

## 2022-02-02 DIAGNOSIS — G4710 Hypersomnia, unspecified: Secondary | ICD-10-CM | POA: Insufficient documentation

## 2022-02-02 DIAGNOSIS — E669 Obesity, unspecified: Secondary | ICD-10-CM | POA: Insufficient documentation

## 2022-02-02 NOTE — Assessment & Plan Note (Signed)
Healthy weight loss encouraged. Discussed the association between obesity and OSA.

## 2022-02-02 NOTE — Assessment & Plan Note (Addendum)
She has excessive daytime fatigue symptoms, unintentionally falls asleep throughout the day, restless night sleep, and chronic headaches. Young female. Epworth 14. She underwent HST in 09/2020 with no significant OSA. I am concerned that she could have narcolepsy. We will ensure that she does not have any sleep disordered breathing with split night sleep study and then complete MSLT the following day for evaluation of narcolepsy. Encouraged her to work on sleep hygiene and keep a sleep diary in the interim. Cautioned to be aware of drowsy driving and pull over if she becomes sleepy.  Patient Instructions  Given your symptoms, I am concerned for sleep disordered breathing as well as possible narcolepsy. We will set you up for an overnight split night sleep study with sleep latency test to follow.   Use caution while driving and pull over if you become sleepy  Try to schedule 1-2 naps during the day, if possible, and see how this helps  Keep a sleep diary Write down: ? When you sleep. ? When you wake up during the night. ? How well you sleep and how rested you feel the next day. ? Any side effects of medicines you are taking. ? What you eat and drink.  Follow up in 6-8 weeks with Florentina Addison Dvante Hands,NP or sooner if needed

## 2022-02-06 DIAGNOSIS — F3181 Bipolar II disorder: Secondary | ICD-10-CM | POA: Diagnosis not present

## 2022-02-06 NOTE — Progress Notes (Signed)
Reviewed and agree with assessment/plan.   Corydon Schweiss, MD [facility] 02/06/2022, 8:08 AM Pager:  [phone]  

## 2022-02-07 DIAGNOSIS — M542 Cervicalgia: Secondary | ICD-10-CM | POA: Diagnosis not present

## 2022-02-07 DIAGNOSIS — G43719 Chronic migraine without aura, intractable, without status migrainosus: Secondary | ICD-10-CM | POA: Diagnosis not present

## 2022-02-19 DIAGNOSIS — F3181 Bipolar II disorder: Secondary | ICD-10-CM | POA: Diagnosis not present

## 2022-02-19 DIAGNOSIS — M542 Cervicalgia: Secondary | ICD-10-CM | POA: Diagnosis not present

## 2022-02-19 DIAGNOSIS — G43839 Menstrual migraine, intractable, without status migrainosus: Secondary | ICD-10-CM | POA: Diagnosis not present

## 2022-02-20 DIAGNOSIS — F411 Generalized anxiety disorder: Secondary | ICD-10-CM | POA: Diagnosis not present

## 2022-02-20 DIAGNOSIS — F5081 Binge eating disorder: Secondary | ICD-10-CM | POA: Diagnosis not present

## 2022-02-20 DIAGNOSIS — F900 Attention-deficit hyperactivity disorder, predominantly inattentive type: Secondary | ICD-10-CM | POA: Diagnosis not present

## 2022-02-20 DIAGNOSIS — F340 Cyclothymic disorder: Secondary | ICD-10-CM | POA: Diagnosis not present

## 2022-03-05 DIAGNOSIS — F3181 Bipolar II disorder: Secondary | ICD-10-CM | POA: Diagnosis not present

## 2022-03-06 DIAGNOSIS — M542 Cervicalgia: Secondary | ICD-10-CM | POA: Diagnosis not present

## 2022-03-06 DIAGNOSIS — G43719 Chronic migraine without aura, intractable, without status migrainosus: Secondary | ICD-10-CM | POA: Diagnosis not present

## 2022-03-27 ENCOUNTER — Ambulatory Visit (HOSPITAL_BASED_OUTPATIENT_CLINIC_OR_DEPARTMENT_OTHER): Payer: BC Managed Care – PPO | Attending: Nurse Practitioner | Admitting: Pulmonary Disease

## 2022-03-27 DIAGNOSIS — G4710 Hypersomnia, unspecified: Secondary | ICD-10-CM | POA: Insufficient documentation

## 2022-04-04 ENCOUNTER — Telehealth: Payer: Self-pay

## 2022-04-04 ENCOUNTER — Ambulatory Visit: Payer: BC Managed Care – PPO | Admitting: Nurse Practitioner

## 2022-04-04 NOTE — Telephone Encounter (Signed)
Call pt to reschedule OV on 04/04/22. LVMTCB. If pt calls back please reschedule for 1 week from now.

## 2022-04-06 DIAGNOSIS — F340 Cyclothymic disorder: Secondary | ICD-10-CM | POA: Diagnosis not present

## 2022-04-06 DIAGNOSIS — F5081 Binge eating disorder: Secondary | ICD-10-CM | POA: Diagnosis not present

## 2022-04-06 DIAGNOSIS — F900 Attention-deficit hyperactivity disorder, predominantly inattentive type: Secondary | ICD-10-CM | POA: Diagnosis not present

## 2022-04-06 DIAGNOSIS — F411 Generalized anxiety disorder: Secondary | ICD-10-CM | POA: Diagnosis not present

## 2022-04-08 ENCOUNTER — Telehealth: Payer: Self-pay | Admitting: Pulmonary Disease

## 2022-04-08 NOTE — Telephone Encounter (Signed)
Call patient  Sleep study result  Date of study: 03/27/2022  Impression: Negative for significant sleep disordered breathing Excessive daytime sleepiness Home sleep study April 2023 was negative for significant sleep disordered breathing  Recommendation: Further evaluation of contributors to excessive daytime sleepiness Encourage weight loss measures Sleep hygiene should be optimized  As noted in the evaluation, MSLT may be considered if there is significant clinical concern for narcolepsy, REM sleep latency during the study was normal

## 2022-04-08 NOTE — Procedures (Signed)
POLYSOMNOGRAPHY  Last, First: Kimberly, Wolf MRN: [id number] Gender: Female Age (years): 31 Weight (lbs): 195 DOB: Dec 02, 1990 BMI: 36 Primary Care: No PCP Epworth Score: 9 Referring: Clayton Bibles NP Technician: Laren Everts Interpreting: Laurin Coder MD Study Type: NPSG Ordered Study Type: Split Night CPAP Study date: 03/27/2022 Location: [facility] CLINICAL INFORMATION Kimberly Wolf is a 31 year old Female and was referred to the sleep center for evaluation of G47.30 Sleep Apnea, Unspecified (780.57). Indications include Excessive Daytime Sleepiness, Fatigue, Morning Headaches, Obesity, Re-Evaluation.  MEDICATIONS Patient self administered medications include: N/A. Medications administered during study include No sleep medicine administered.  SLEEP STUDY TECHNIQUE A multi-channel overnight Polysomnography study was performed. The channels recorded and monitored were central and occipital EEG, electrooculogram (EOG), submentalis EMG (chin), nasal and oral airflow, thoracic and abdominal wall motion, anterior tibialis EMG, snore microphone, electrocardiogram, and a pulse oximetry. TECHNICIAN COMMENTS Comments added by Technician: NONE Comments added by Scorer: N/A SLEEP ARCHITECTURE The study was initiated at 10:58:40 PM and terminated at 5:13:00 AM. The total recorded time was 374.3 minutes. EEG confirmed total sleep time was 349.5 minutes yielding a sleep efficiency of 93.4%%. Sleep onset after lights out was 9.5 minutes with a REM latency of 119.5 minutes. The patient spent 2.6%% of the night in stage N1 sleep, 79.8%% in stage N2 sleep, 0.9%% in stage N3 and 16.7% in REM. Wake after sleep onset (WASO) was 15.4 minutes. The Arousal Index was 10.5/hour. RESPIRATORY PARAMETERS There were a total of 4 respiratory disturbances out of which 0 were apneas ( 0 obstructive, 0 mixed, 0 central) and 4 hypopneas. The apnea/hypopnea index (AHI) was 0.7 events/hour. The central sleep  apnea index was 0 events/hour. The REM AHI was 3.1 events/hour and NREM AHI was 0.2 events/hour. The supine AHI was 1.7 events/hour and the non supine AHI was 0 supine during 41.06% of sleep. Respiratory disturbances were associated with oxygen desaturation down to a nadir of 92.0% during sleep. The mean oxygen saturation during the study was 97.0%. The cumulative time under 88% oxygen saturation was 5.5 minutes.  LEG MOVEMENT DATA The total leg movements were 0 with a resulting leg movement index of 0.0/hr .Associated arousal with leg movement index was 0.0/hr.  CARDIAC DATA The underlying cardiac rhythm was most consistent with sinus rhythm. Mean heart rate during sleep was 76.2 bpm. Additional rhythm abnormalities include None. IMPRESSIONS - No Significant Obstructive Sleep apnea(OSA) - EKG showed no cardiac abnormalities. - No significant Oxygen Desaturation - No snoring was audible during this study. - REM latency is normal - No significant periodic leg movements(PLMs) during sleep. However, no significant associated arousals.  DIAGNOSIS - Excessive daytime sleepiness - No significannt obstructive sleep apnea  RECOMMENDATIONS - Further evaluation of contributors to excessive daytime sleepiness - MSLT may be considered in the right clinical context, if there is significant concern for narcolepsy. However, REM latency is normal in this current study - Avoid alcohol, sedatives and other CNS depressants that may worsen sleep apnea and disrupt normal sleep architecture. - Sleep hygiene should be reviewed to assess factors that may improve sleep quality. - Weight management and regular exercise should be initiated or continued.  [Electronically signed] 04/08/2022 05:14 PM  Sherrilyn Rist MD NPI: [id number]

## 2022-04-09 NOTE — Telephone Encounter (Signed)
Called and informed patient of Dr Os recommendations for sleep study but I did advise her the Joellen Jersey would go over more detail with her at office visit this week. She verbalized understanding. Nothing further needed

## 2022-04-11 ENCOUNTER — Other Ambulatory Visit: Payer: Self-pay

## 2022-04-11 ENCOUNTER — Ambulatory Visit: Payer: BC Managed Care – PPO | Admitting: Nurse Practitioner

## 2022-04-23 DIAGNOSIS — Z309 Encounter for contraceptive management, unspecified: Secondary | ICD-10-CM | POA: Diagnosis not present

## 2022-04-23 DIAGNOSIS — Z01419 Encounter for gynecological examination (general) (routine) without abnormal findings: Secondary | ICD-10-CM | POA: Diagnosis not present

## 2022-04-23 DIAGNOSIS — Z1389 Encounter for screening for other disorder: Secondary | ICD-10-CM | POA: Diagnosis not present

## 2022-04-23 DIAGNOSIS — Z130 Encounter for screening for diseases of the blood and blood-forming organs and certain disorders involving the immune mechanism: Secondary | ICD-10-CM | POA: Diagnosis not present

## 2022-04-23 DIAGNOSIS — E282 Polycystic ovarian syndrome: Secondary | ICD-10-CM | POA: Diagnosis not present

## 2022-04-30 DIAGNOSIS — F3181 Bipolar II disorder: Secondary | ICD-10-CM | POA: Diagnosis not present

## 2022-07-04 ENCOUNTER — Ambulatory Visit
Admission: EM | Admit: 2022-07-04 | Discharge: 2022-07-04 | Disposition: A | Discharge status: Home / Self Care | Payer: BC Managed Care – PPO

## 2022-07-04 DIAGNOSIS — B372 Candidiasis of skin and nail: Secondary | ICD-10-CM

## 2022-07-04 MED ORDER — FLUCONAZOLE 150 MG PO TABS: 150.0000 mg | ORAL_TABLET | Freq: Once | ORAL | 0 refills | Status: AC

## 2022-07-04 MED ORDER — NYSTATIN 100000 UNIT/GM EX POWD: 1.0000 | Freq: Three times a day (TID) | CUTANEOUS | 0 refills | Status: AC

## 2022-07-04 NOTE — ED Provider Notes (Signed)
[facility]    CSN: [id number] Arrival date & time: 07/04/22  1729      History   Chief Complaint Chief Complaint  Patient presents with   Rash         HPI Kimberly Wolf is a 32 y.o. female.   Patient presents with painful rash to left armpit that has been present for approximately 1 week.  Patient reports that this has occurred before and she thinks that she was treated with antibiotics with resolution.  Denies any associated fever.   Rash   Past Medical History:  Diagnosis Date   Anxiety    Asthma    Migraines     Patient Active Problem List   Diagnosis Date Noted   Hypersomnia 02/02/2022   Obesity (BMI 30.0-34.9) 02/02/2022    Past Surgical History:  Procedure Laterality Date   ADENOIDECTOMY     TONSILLECTOMY      OB History   No obstetric history on file.      Home Medications    Prior to Admission medications   Medication Sig Start Date End Date Taking? Authorizing Provider  busPIRone (BUSPAR) 10 MG tablet Take 10 mg by mouth 2 (two) times daily. 06/05/22  Yes [provider]  fluconazole (DIFLUCAN) 150 MG tablet Take 1 tablet (150 mg total) by mouth once for 1 dose. 07/04/22 07/04/22 Yes Stephanny Tsutsui, Michele Rockers, FNP  lisdexamfetamine (VYVANSE) 50 MG capsule Take 50 mg by mouth every morning. 06/21/22  Yes [provider]  nystatin powder Apply 1 Application topically 3 (three) times daily. 07/04/22  Yes Sylvester Salonga, Hildred Alamin E, FNP  ALBUTEROL SULFATE ER PO Take by mouth.    [provider]  Norgestimate-Eth Estradiol (ESTARYLLA PO) Take by mouth.    [provider]  VRAYLAR 1.5 MG capsule Take 1.5 mg by mouth daily. 01/30/22   [provider]  zonisamide (ZONEGRAN) 25 MG capsule Take 100 mg by mouth daily. 01/13/22   [provider]    Family History History reviewed. No pertinent family history.  Social History Social History   Tobacco Use   Smoking status: Never   Smokeless tobacco: Never   Substance Use Topics   Drug use: Never     Allergies   Patient has no known allergies.   Review of Systems Review of Systems Per HPI  Physical Exam Triage Vital Signs ED Triage Vitals [07/04/22 1747]  Enc Vitals Group     BP 130/85     Pulse Rate 89     Resp 16     Temp 98.3 F (36.8 C)     Temp Source Oral     SpO2 98 %     Weight      Height      Head Circumference      Peak Flow      Pain Score 3     Pain Loc      Pain Edu?      Excl. in [place]?    No data found.  Updated Vital Signs BP 130/85 (BP Location: Right Arm)   Pulse 89   Temp 98.3 F (36.8 C) (Oral)   Resp 16   SpO2 98%   Visual Acuity Right Eye Distance:   Left Eye Distance:   Bilateral Distance:    Right Eye Near:   Left Eye Near:    Bilateral Near:     Physical Exam Constitutional:      General: She is not in acute distress.  Appearance: Normal appearance. She is not toxic-appearing or diaphoretic.  HENT:     Head: Normocephalic and atraumatic.  Eyes:     Extraocular Movements: Extraocular movements intact.     Conjunctiva/sclera: Conjunctivae normal.  Pulmonary:     Effort: Pulmonary effort is normal.  Skin:    Comments: Flat, erythematous rash present to majority of left armpit.  There is no area of induration or fluctuance.  No drainage noted.  Neurological:     General: No focal deficit present.     Mental Status: She is alert and oriented to person, place, and time. Mental status is at baseline.  Psychiatric:        Mood and Affect: Mood normal.        Behavior: Behavior normal.        Thought Content: Thought content normal.        Judgment: Judgment normal.      UC Treatments / Results  Labs (all labs ordered are listed, but only abnormal results are displayed) Labs Reviewed - No data to display  EKG   Radiology No results found.  Procedures Procedures (including critical care time)  Medications Ordered in UC Medications - No data to display  Initial  Impression / Assessment and Plan / UC Course  I have reviewed the triage vital signs and the nursing notes.  Pertinent labs & imaging results that were available during my care of the patient were reviewed by me and considered in my medical decision making (see chart for details).     Rash appears consistent with candidiasis.  No indication of bacterial infection on exam.  Will treat with nystatin powder.  Advised patient to keep free from moisture.  Will prescribe Diflucan as well.  Although, will only do 1 pill given patient's daily medications to avoid interaction and adverse effects.  Patient was advised to follow-up if symptoms persist or worsen.  Patient verbalized understanding and was agreeable with plan. Final Clinical Impressions(s) / UC Diagnoses   Final diagnoses:  Candidal skin infection     Discharge Instructions      It appears that you have yeast in your armpit.  I have prescribed 2 medications to alleviate symptoms.  Please follow-up if any symptoms persist or worsen.    ED Prescriptions     Medication Sig Dispense Auth. Provider   fluconazole (DIFLUCAN) 150 MG tablet Take 1 tablet (150 mg total) by mouth once for 1 dose. 1 tablet [place], [place] E, [place]   nystatin powder Apply 1 Application topically 3 (three) times daily. 15 g Teodora Medici, [place]      PDMP not reviewed this encounter.   Teodora Medici, [place] 07/04/22 [phone]

## 2022-07-04 NOTE — Discharge Instructions (Addendum)
It appears that you have yeast in your armpit.  I have prescribed 2 medications to alleviate symptoms.  Please follow-up if any symptoms persist or worsen.

## 2022-07-04 NOTE — ED Triage Notes (Signed)
Pt c/o rash under left arm first noticed ~ 1 week ago. States this happened a few months ago and given abx.

## 2022-08-15 DIAGNOSIS — G4710 Hypersomnia, unspecified: Secondary | ICD-10-CM

## 2022-08-15 NOTE — Telephone Encounter (Signed)
Received a message from patient asking about the status of her MSLT. Per her chart, it was authorized back in September 2023.   Can someone reach out to her to get this scheduled?

## 2022-08-20 NOTE — Telephone Encounter (Signed)
A MSLT can not be ordered by itself has to have a reg sleep study with it

## 2022-08-21 NOTE — Telephone Encounter (Signed)
From my understanding it has to be the night before they are a back to back test

## 2022-08-21 NOTE — Telephone Encounter (Signed)
There was a split night study performed in 03/2022. Will this work?

## 2022-08-22 NOTE — Telephone Encounter (Signed)
Orders were placed.  Routing MyChart message back to triage so it can be closed.

## 2022-08-22 NOTE — Telephone Encounter (Signed)
The MSLT should have followed her last sleep study and for some reason, the sleep center did not complete this. I have reordered the MSLT with NPSG the night before. Unfortunately, this is a requirement of the sleep association/sleep center. They need to schedule her ASAP as she has been waiting on this. Thanks.

## 2022-09-13 ENCOUNTER — Ambulatory Visit
Admission: EM | Admit: 2022-09-13 | Discharge: 2022-09-13 | Disposition: A | Discharge status: Home / Self Care | Payer: BC Managed Care – PPO | Attending: Physician Assistant | Admitting: Physician Assistant

## 2022-09-13 DIAGNOSIS — J4540 Moderate persistent asthma, uncomplicated: Secondary | ICD-10-CM | POA: Diagnosis not present

## 2022-09-13 MED ORDER — PREDNISONE 50 MG PO TABS: ORAL_TABLET | ORAL | 0 refills | Status: AC

## 2022-09-13 MED ORDER — ALBUTEROL SULFATE HFA 108 (90 BASE) MCG/ACT IN AERS
2.0000 | INHALATION_SPRAY | RESPIRATORY_TRACT | Status: DC
  Administered 2022-09-13: 2 via RESPIRATORY_TRACT

## 2022-09-13 NOTE — ED Provider Notes (Signed)
[facility]    CSN: [id number] Arrival date & time: 09/13/22  1508      History   Chief Complaint Chief Complaint  Patient presents with   Cough    HPI Kimberly Wolf is a 32 y.o. female.   Patient complains of having a asthma attack today.  Patient reports that she used her albuterol inhaler this a.m. however she got to carry it with her.  Patient reports she is breathing some better now but it still feels like she is wheezing.  Patient reports that she sometimes has had triggers from pollen.  Patient also had COVID 2 weeks ago  The history is provided by the patient. No language interpreter was used.  Cough Cough characteristics:  Non-productive Sputum characteristics:  Nondescript   Past Medical History:  Diagnosis Date   Anxiety    Asthma    Migraines     Patient Active Problem List   Diagnosis Date Noted   Hypersomnia 02/02/2022   Obesity (BMI 30.0-34.9) 02/02/2022    Past Surgical History:  Procedure Laterality Date   ADENOIDECTOMY     TONSILLECTOMY      OB History   No obstetric history on file.      Home Medications    Prior to Admission medications   Medication Sig Start Date End Date Taking? Authorizing Provider  predniSONE (DELTASONE) 50 MG tablet One tablet a day 09/13/22  Yes Elson Areas, PA-C  ALBUTEROL SULFATE ER PO Take by mouth.    [provider]  busPIRone (BUSPAR) 10 MG tablet Take 10 mg by mouth 2 (two) times daily. 06/05/22   [provider]  lisdexamfetamine (VYVANSE) 50 MG capsule Take 50 mg by mouth every morning. 06/21/22   [provider]  Norgestimate-Eth Estradiol (ESTARYLLA PO) Take by mouth.    [provider]  nystatin powder Apply 1 Application topically 3 (three) times daily. 07/04/22   [place], Acie Fredrickson, FNP  VRAYLAR 1.5 MG capsule Take 1.5 mg by mouth daily. 01/30/22   [provider]  zonisamide (ZONEGRAN) 25 MG capsule Take 100 mg by mouth daily. 01/13/22    [provider]    Family History History reviewed. No pertinent family history.  Social History Social History   Tobacco Use   Smoking status: Never   Smokeless tobacco: Never  Substance Use Topics   Drug use: Never     Allergies   Patient has no known allergies.   Review of Systems Review of Systems  Respiratory:  Positive for cough.   All other systems reviewed and are negative.    Physical Exam Triage Vital Signs ED Triage Vitals [09/13/22 1651]  Enc Vitals Group     BP 137/87     Pulse Rate (!) 102     Resp 16     Temp 98.5 F (36.9 C)     Temp Source Oral     SpO2 98 %     Weight      Height      Head Circumference      Peak Flow      Pain Score 0     Pain Loc      Pain Edu?      Excl. in GC?    No data found.  Updated Vital Signs BP 137/87 (BP Location: Right Arm)   Pulse (!) 102   Temp 98.5 F (36.9 C) (Oral)   Resp 16   LMP 09/11/2022 (Exact Date)  SpO2 98%   Visual Acuity Right Eye Distance:   Left Eye Distance:   Bilateral Distance:    Right Eye Near:   Left Eye Near:    Bilateral Near:     Physical Exam Vitals and nursing note reviewed.  Constitutional:      Appearance: She is well-developed.  HENT:     Head: Normocephalic.  Cardiovascular:     Rate and Rhythm: Normal rate.  Pulmonary:     Breath sounds: Wheezing present.  Abdominal:     General: There is no distension.  Musculoskeletal:        General: Normal range of motion.     Cervical back: Normal range of motion.  Skin:    General: Skin is warm.  Neurological:     General: No focal deficit present.     Mental Status: She is alert and oriented to person, place, and time.      UC Treatments / Results  Labs (all labs ordered are listed, but only abnormal results are displayed) Labs Reviewed - No data to display  EKG   Radiology No results found.  Procedures Procedures (including critical care time)  Medications Ordered in UC Medications   albuterol (VENTOLIN HFA) 108 (90 Base) MCG/ACT inhaler 2 puff (has no administration in time range)    Initial Impression / Assessment and Plan / UC Course  I have reviewed the triage vital signs and the nursing notes.  Pertinent labs & imaging results that were available during my care of the patient were reviewed by me and considered in my medical decision making (see chart for details).     Given albuterol 2 puffs here inhaler to go she is given a prescription for prednisone patient is advised to follow-up with her primary care physician for recheck she is to return if any problems Final Clinical Impressions(s) / UC Diagnoses   Final diagnoses:  Moderate persistent asthma without complication     Discharge Instructions      Return if any problems.    ED Prescriptions     Medication Sig Dispense Auth. Provider   predniSONE (DELTASONE) 50 MG tablet One tablet a day 5 tablet Elson Areas, [place]      PDMP not reviewed this encounter. An After Visit Summary was printed and given to the patient.       Elson Areas, [place] 09/13/22 1714

## 2022-09-13 NOTE — ED Triage Notes (Signed)
Pt states coughing for two weeks after having covid states today she was feeling SOB and was wheezing. History of asthma did not have her inhaler with her .

## 2022-09-13 NOTE — Discharge Instructions (Addendum)
Return if any problems.

## 2022-11-19 ENCOUNTER — Ambulatory Visit (HOSPITAL_BASED_OUTPATIENT_CLINIC_OR_DEPARTMENT_OTHER): Payer: BC Managed Care – PPO | Attending: Nurse Practitioner | Admitting: Pulmonary Disease

## 2022-11-20 ENCOUNTER — Encounter (HOSPITAL_BASED_OUTPATIENT_CLINIC_OR_DEPARTMENT_OTHER): Payer: BC Managed Care – PPO | Admitting: Pulmonary Disease

## 2022-12-01 IMAGING — CT CT HEAD W/O CM
4 series · 16 of 47 positions shown, 18 images · non-contrast
Comparison: None Available.

CLINICAL DATA: Transient ischemic attack (TIA)



[Series 3: head wo · axial · 0.45mm/px · z∈[-107,+18]mm · 7 of 35 slices shown, 9 images]
[im 5/35  brain]
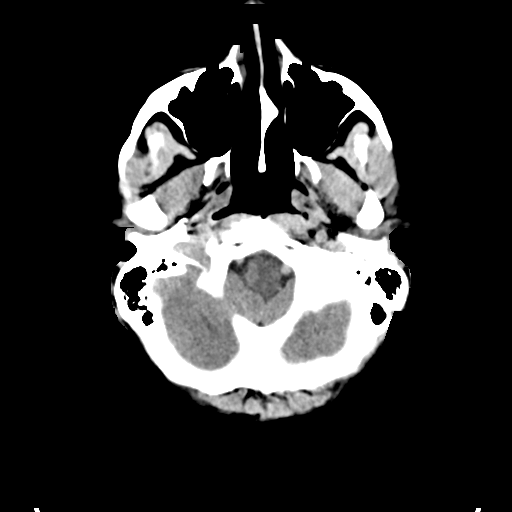
[im 5/35  bone]
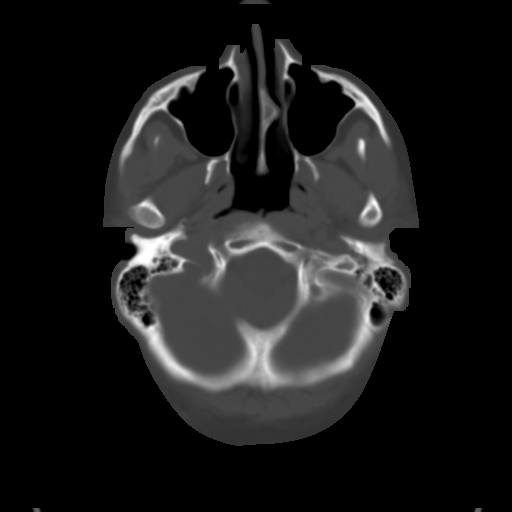
[im 9/35  brain]
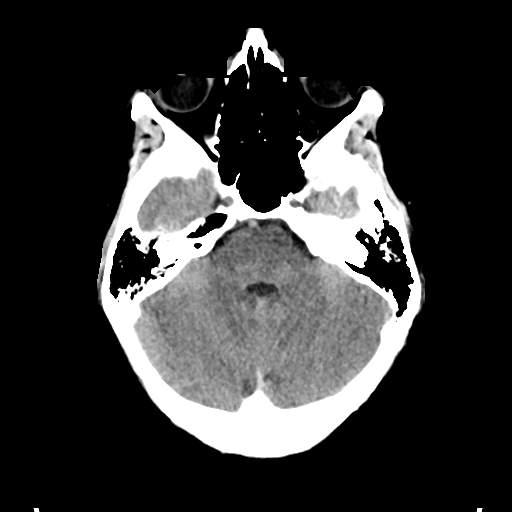
[im 13/35  brain]
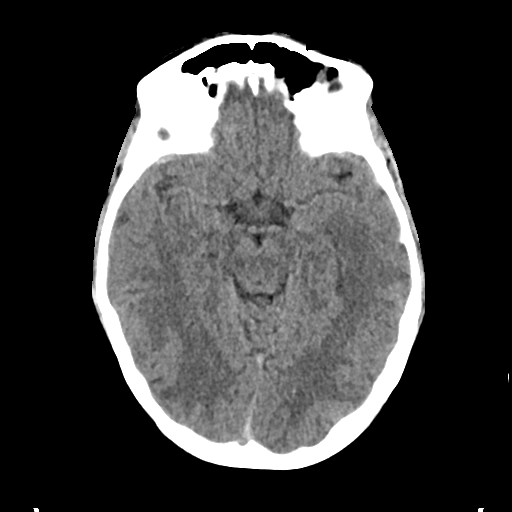
[im 18/35  brain]
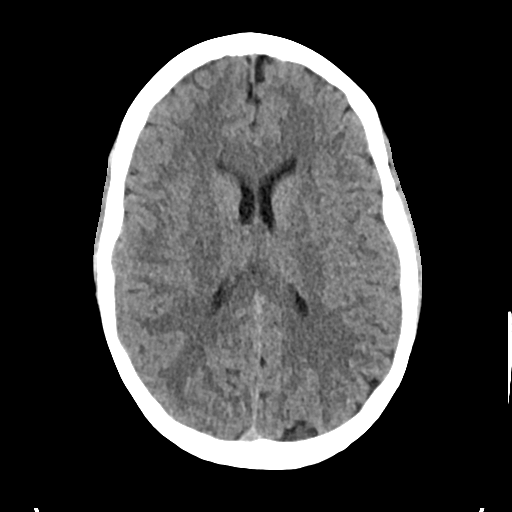
[im 22/35  brain]
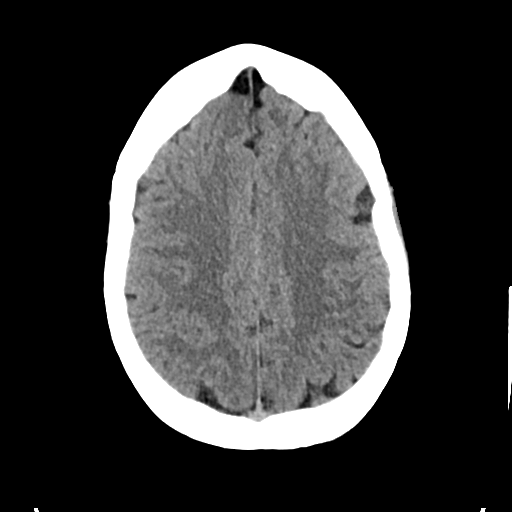
[im 22/35  bone]
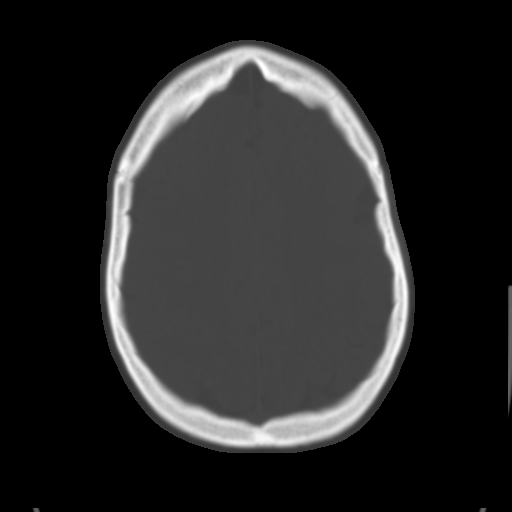
[im 26/35  brain]
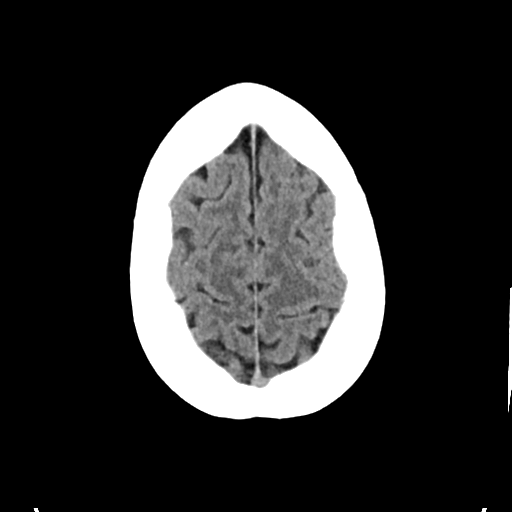
[im 30/35  brain]
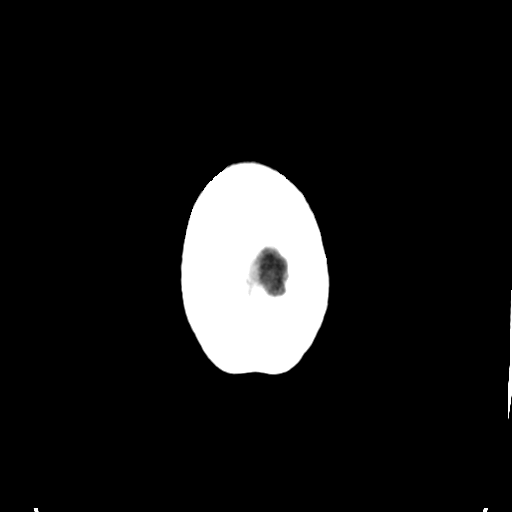

[Series 4: head bone · axial · 0.45mm/px · z∈[-111,-77]mm · 3 of 86 slices shown]
[im 9/86  bone]
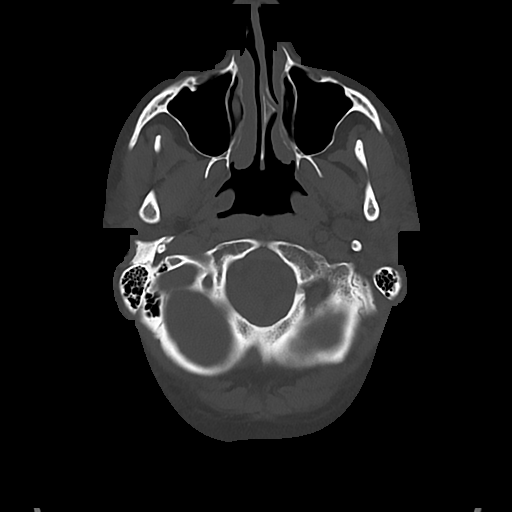
[im 18/86  bone]
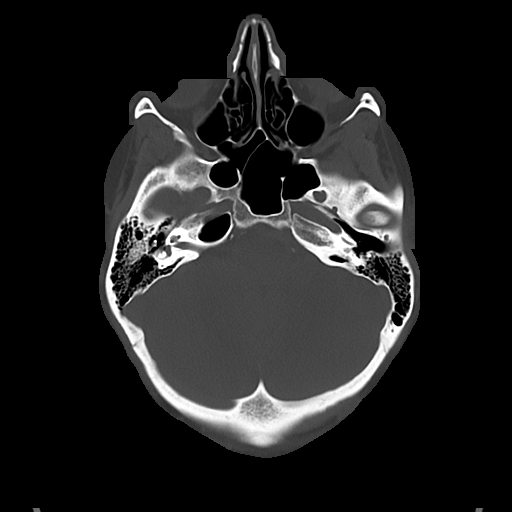
[im 26/86  bone]
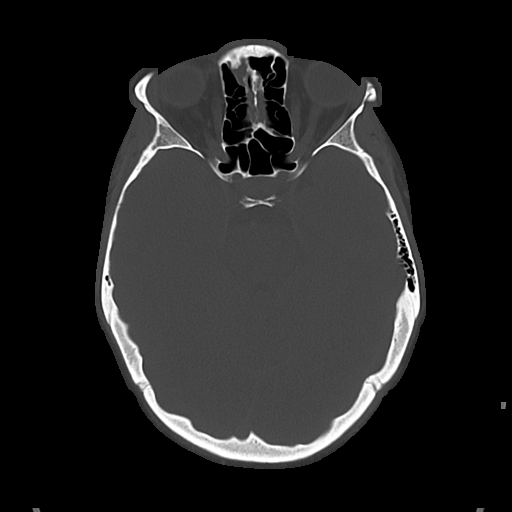

[Series 5: cor soft · coronal · 0.30mm/px · 3 of 74 slices shown]
[im 25/74  brain]
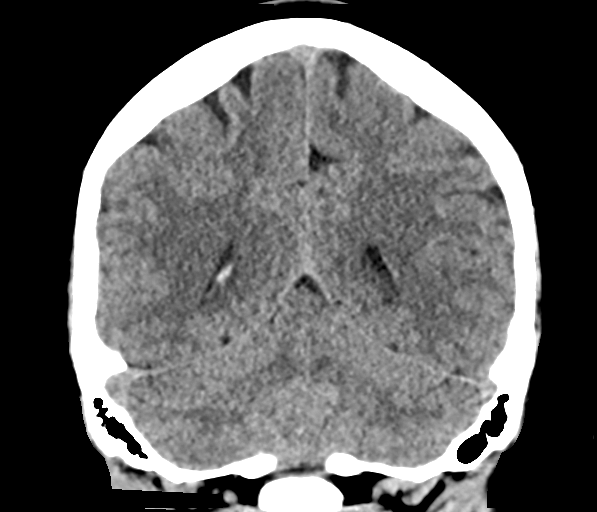
[im 33/74  brain]
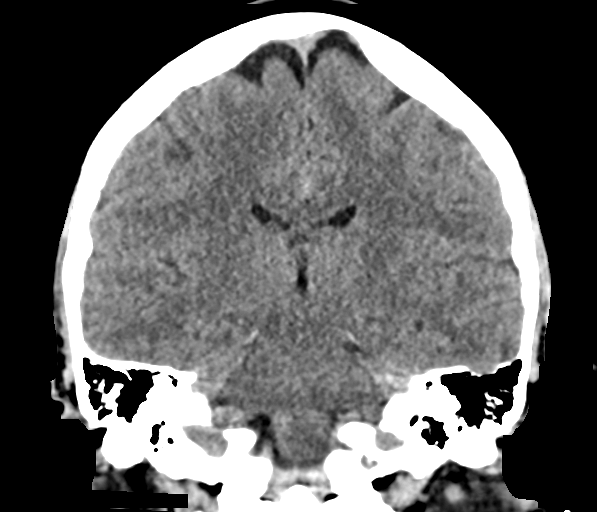
[im 41/74  brain]
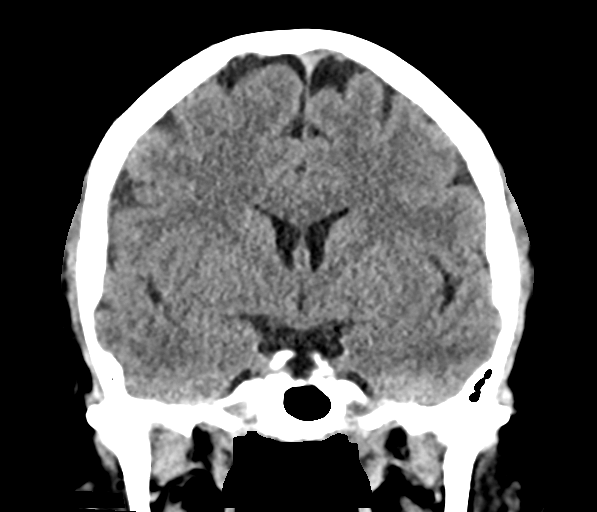

[Series 6: sag soft · sagittal · 0.30mm/px · 3 of 61 slices shown]
[im 21/61  brain]
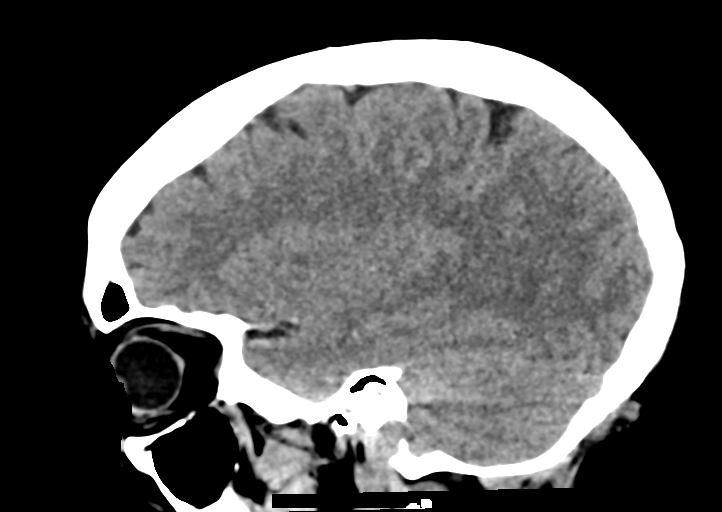
[im 31/61  brain]
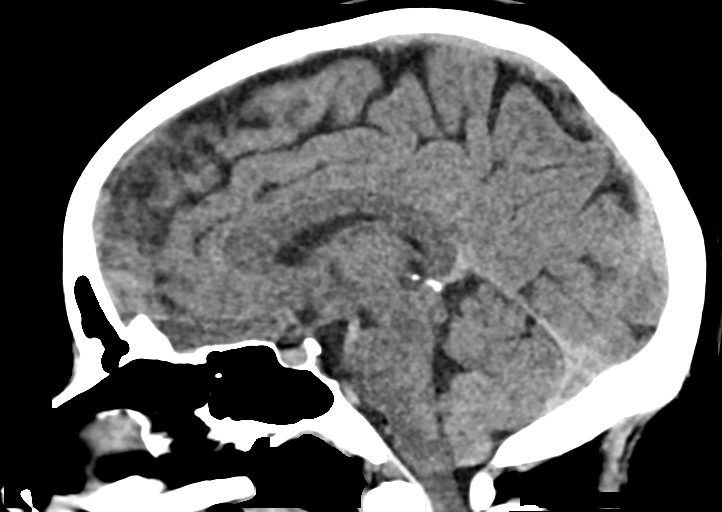
[im 41/61  brain]
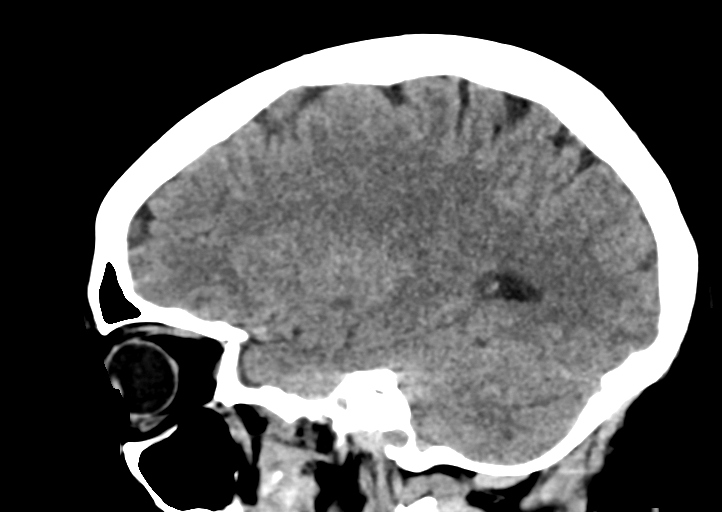

[16 of 47 positions shown; findings below may reference images not displayed]

FINDINGS: Brain: No evidence of acute large vascular territory infarction,
hemorrhage, hydrocephalus, extra-axial collection or mass
lesion/mass effect.

Vascular: No hyperdense vessel identified.

Skull: No acute fracture.

Sinuses/Orbits: Minimal paranasal sinus mucosal thickening. No acute
orbital findings.

Other: No mastoid effusions.
IMPRESSION: No evidence of acute intracranial abnormality.

## 2022-12-01 IMAGING — CT CT ANGIO CHEST
2 of 6 series · 18 of 46 positions shown · IV contrast (APPLIED)
Comparison: None Available.

CLINICAL DATA: Positive D-dimer

EXAM:
CT ANGIOGRAPHY CHEST WITH CONTRAST
TECHNIQUE: Multidetector CT imaging of the chest was performed using the
standard protocol during bolus administration of intravenous
contrast. Multiplanar CT image reconstructions and MIPs were
obtained to evaluate the vascular anatomy.

[Series 6: thins · axial · 0.71mm/px · z∈[+1143,+1375]mm · 15 of 363 slices shown]
[im 16/363  lung]
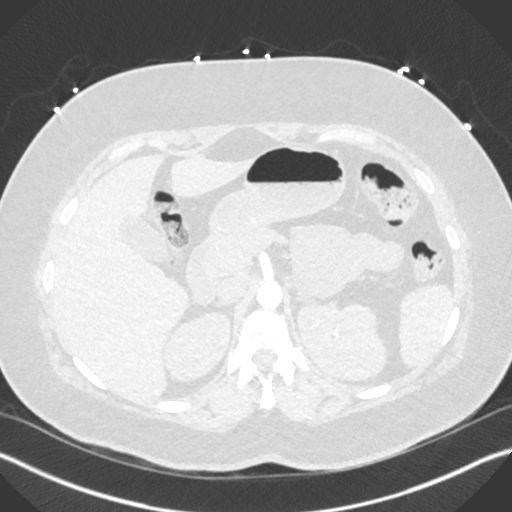
[im 48/363  soft-tissue]
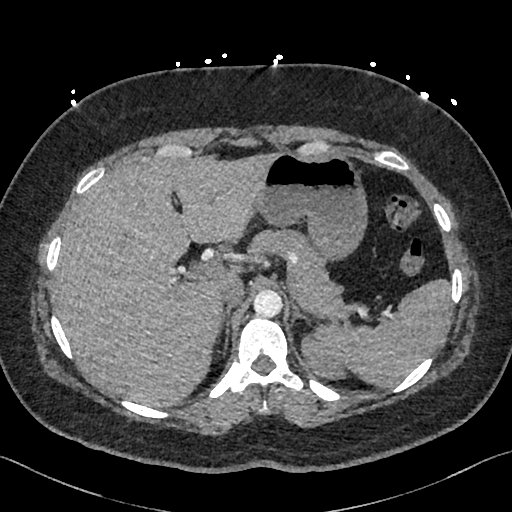
[im 63/363  lung]
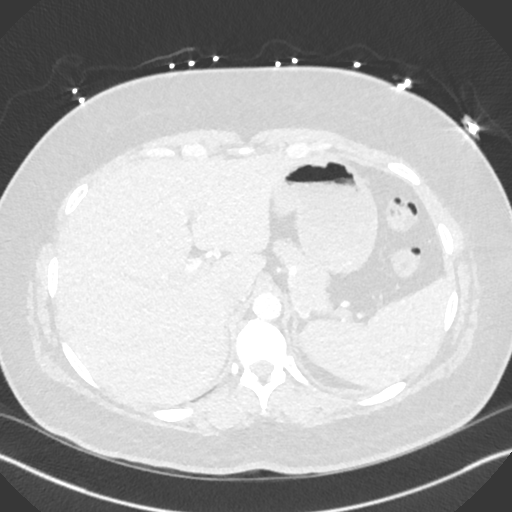
[im 95/363  soft-tissue]
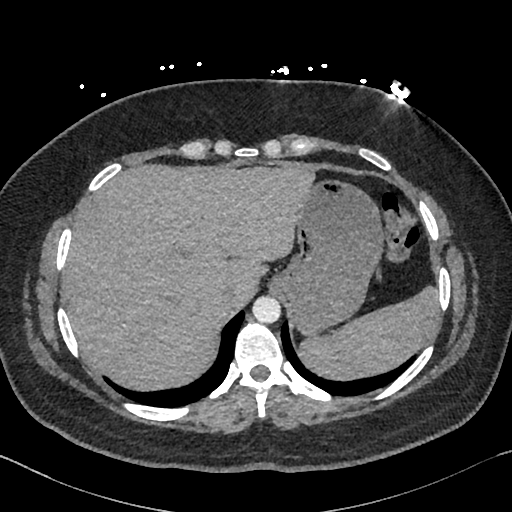
[im 111/363  lung]
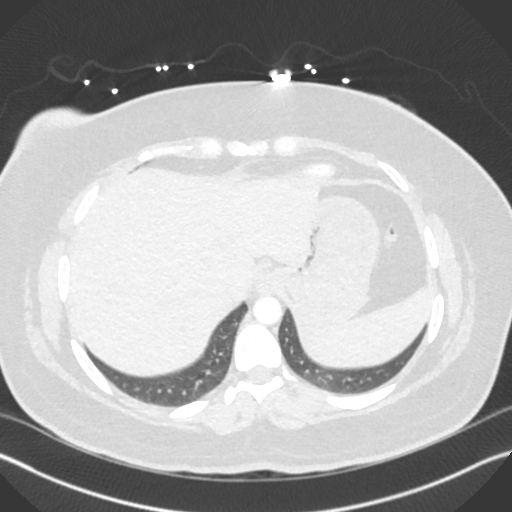
[im 142/363  soft-tissue]
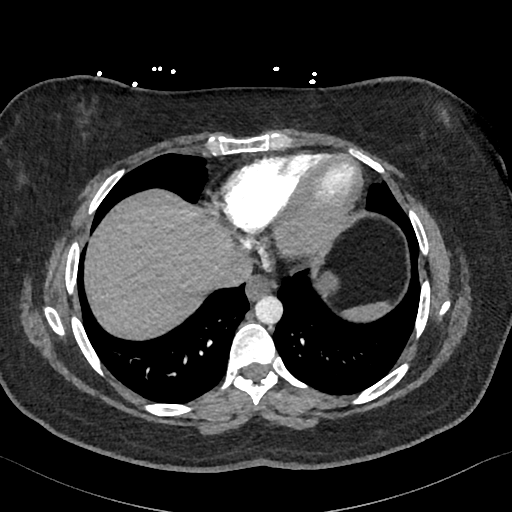
[im 158/363  lung]
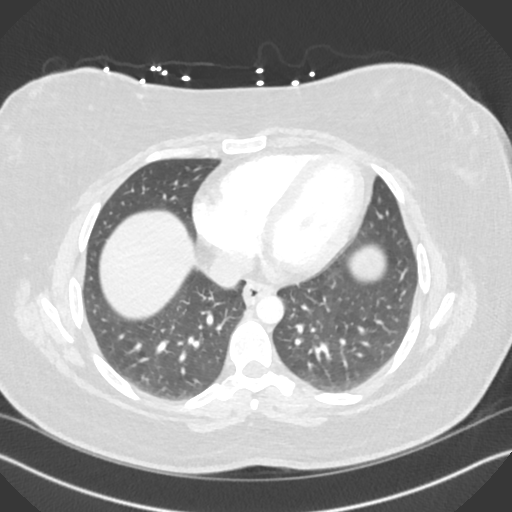
[im 189/363  soft-tissue]
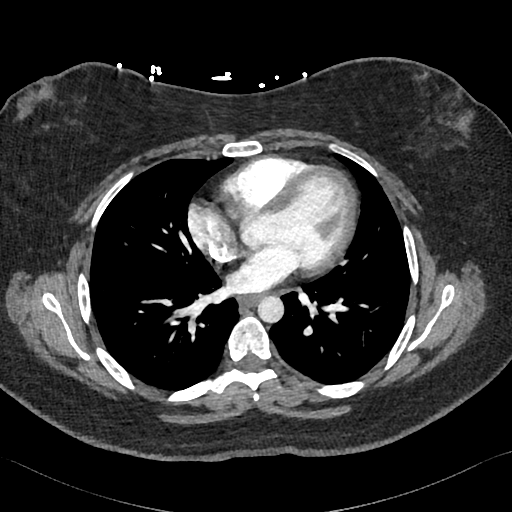
[im 205/363  lung]
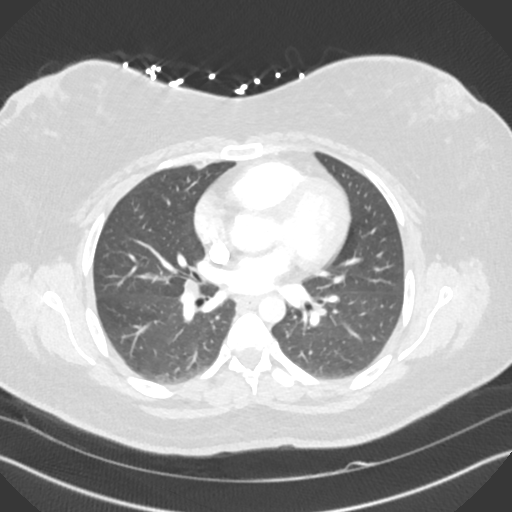
[im 221/363  soft-tissue]
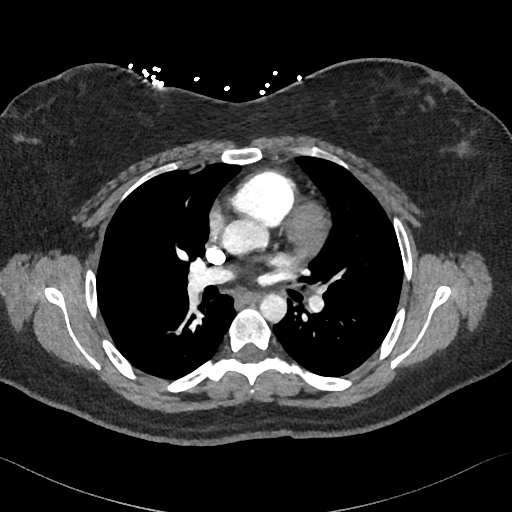
[im 252/363  lung]
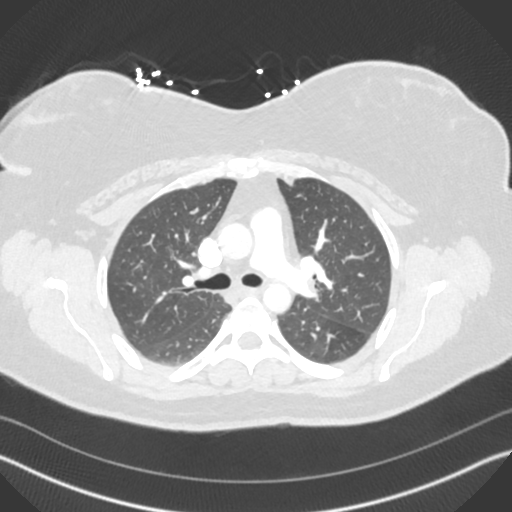
[im 268/363  soft-tissue]
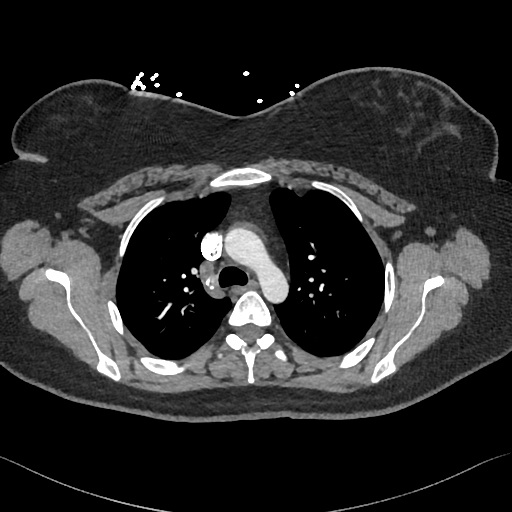
[im 300/363  lung]
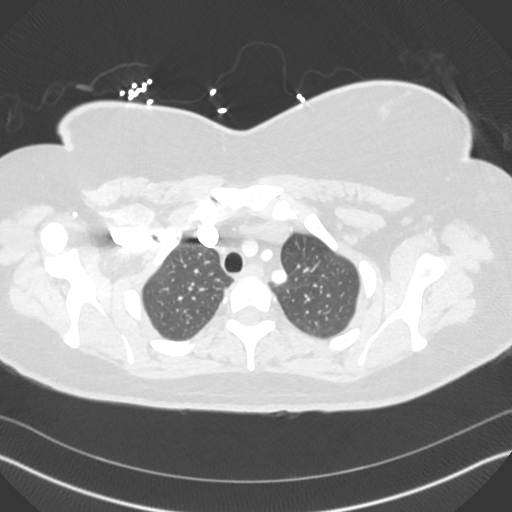
[im 315/363  soft-tissue]
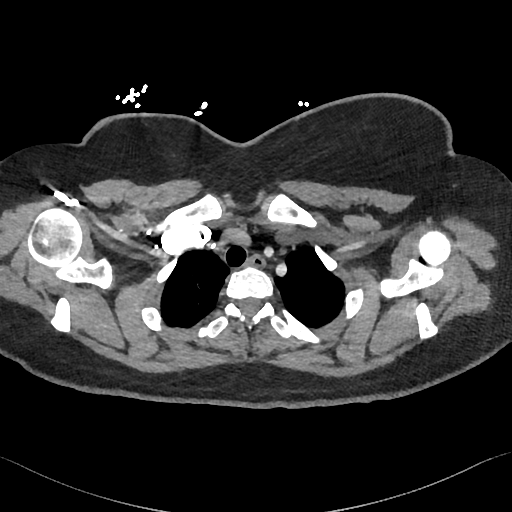
[im 347/363  lung]
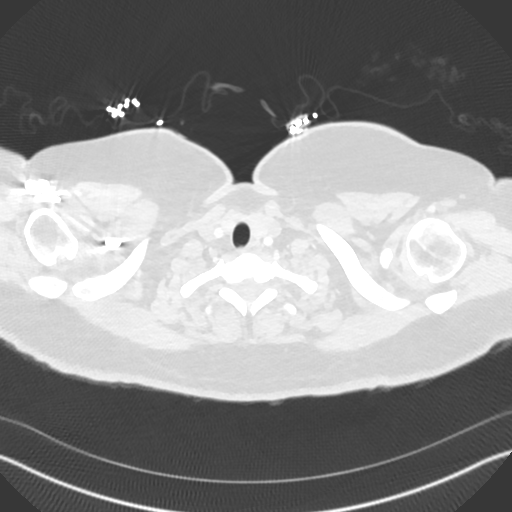

[Series 8: cor · coronal · 0.49mm/px · 3 of 149 slices shown]
[im 38/149  soft-tissue]
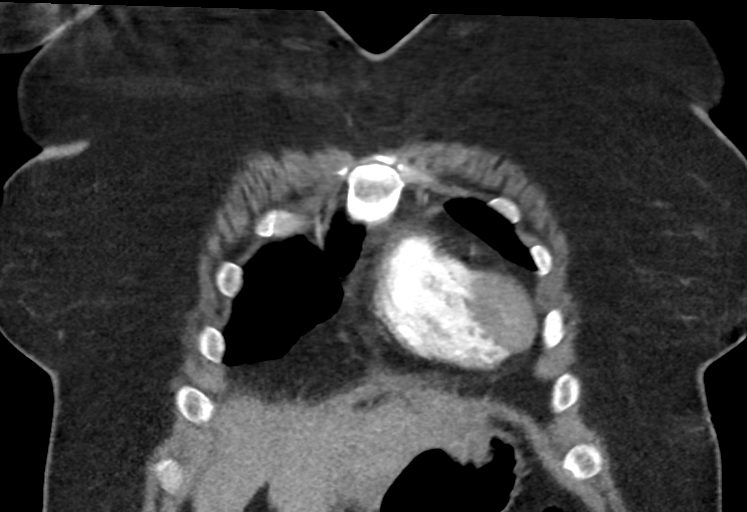
[im 75/149  soft-tissue]
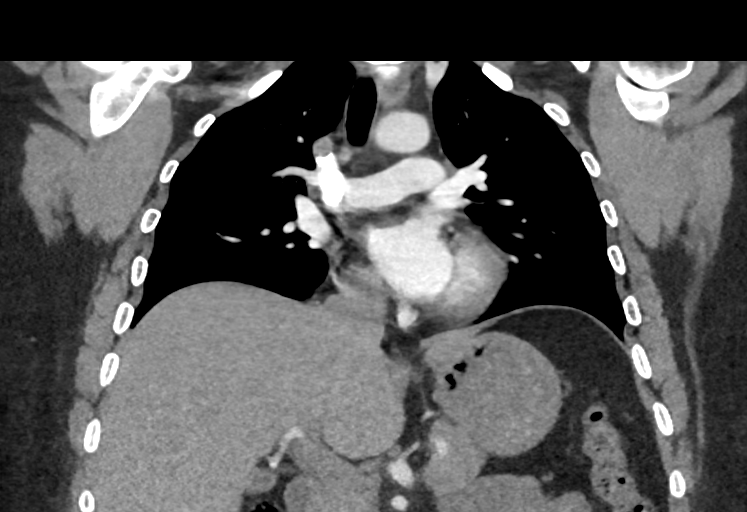
[im 112/149  soft-tissue]
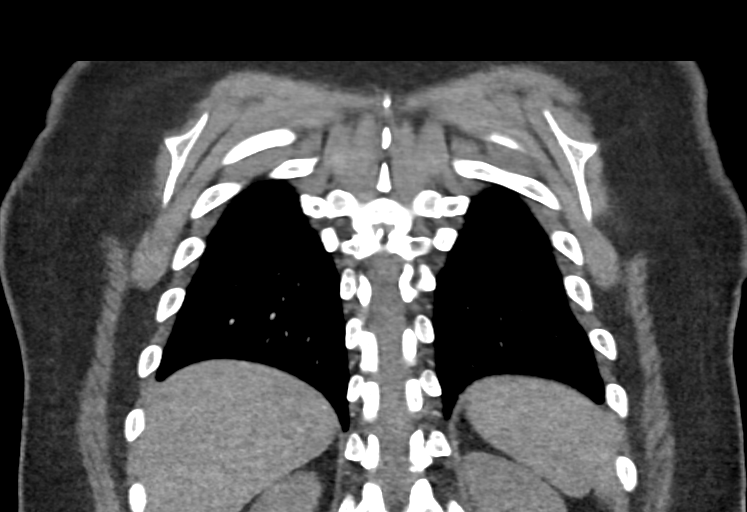

[18 of 46 positions shown; findings below may reference images not displayed]

RADIATION DOSE REDUCTION: This exam was performed according to the
departmental dose-optimization program which includes automated
exposure control, adjustment of the mA and/or kV according to
patient size and/or use of iterative reconstruction technique.

CONTRAST:  59mL OMNIPAQUE IOHEXOL 350 MG/ML SOLN
FINDINGS: Cardiovascular: Satisfactory opacification of the pulmonary arteries
to the segmental level. No evidence of pulmonary embolism. Normal
heart size. No pericardial effusion. Nonaneurysmal aorta.

Mediastinum/Nodes: No enlarged mediastinal, hilar, or axillary lymph
nodes. Thyroid gland, trachea, and esophagus demonstrate no
significant findings.

Lungs/Pleura: Lungs are clear. No pleural effusion or pneumothorax.

Upper Abdomen: No acute abnormality.

Musculoskeletal: No chest wall abnormality. No acute or significant
osseous findings.

Review of the MIP images confirms the above findings.
IMPRESSION: 1. Negative for acute pulmonary embolus.
2. Clear lung fields
